# Patient Record
Sex: Male | Born: 1980
Health system: Southern US, Community
[De-identification: ages and names within clinical notes are randomized; demographics above are authoritative.]

## PROBLEM LIST (undated history)

## (undated) DIAGNOSIS — K219 Gastro-esophageal reflux disease without esophagitis: Secondary | ICD-10-CM

## (undated) HISTORY — PX: WISDOM TOOTH EXTRACTION: SHX21

---

## 2005-10-17 ENCOUNTER — Emergency Department (HOSPITAL_COMMUNITY): Admission: EM | Admit: 2005-10-17 | Discharge: 2005-10-19 | Payer: Self-pay | Admitting: Emergency Medicine

## 2011-08-09 ENCOUNTER — Encounter: Payer: Self-pay | Admitting: Emergency Medicine

## 2011-08-09 ENCOUNTER — Emergency Department (HOSPITAL_COMMUNITY)
Admission: EM | Admit: 2011-08-09 | Discharge: 2011-08-09 | Disposition: A | Discharge status: Home / Self Care | Payer: 59 | Attending: Emergency Medicine | Admitting: Emergency Medicine

## 2011-08-09 DIAGNOSIS — L03317 Cellulitis of buttock: Secondary | ICD-10-CM | POA: Insufficient documentation

## 2011-08-09 DIAGNOSIS — L0231 Cutaneous abscess of buttock: Secondary | ICD-10-CM | POA: Insufficient documentation

## 2011-08-09 DIAGNOSIS — J45909 Unspecified asthma, uncomplicated: Secondary | ICD-10-CM | POA: Insufficient documentation

## 2011-08-09 DIAGNOSIS — L0291 Cutaneous abscess, unspecified: Secondary | ICD-10-CM

## 2011-08-09 MED ORDER — LIDOCAINE HCL (PF) 2 % IJ SOLN
10.0000 mL | Freq: Once | INTRAMUSCULAR | Status: AC
  Administered 2011-08-09: 10 mL
  Filled 2011-08-09: qty 10

## 2011-08-09 MED ORDER — DOXYCYCLINE HYCLATE 100 MG PO TABS
100.0000 mg | ORAL_TABLET | Freq: Once | ORAL | Status: AC
  Administered 2011-08-09: 100 mg via ORAL
  Filled 2011-08-09: qty 1

## 2011-08-09 MED ORDER — HYDROCODONE-ACETAMINOPHEN 5-325 MG PO TABS
1.0000 | ORAL_TABLET | Freq: Once | ORAL | Status: AC
  Administered 2011-08-09: 1 via ORAL
  Filled 2011-08-09: qty 1

## 2011-08-09 MED ORDER — DOXYCYCLINE HYCLATE 100 MG PO CAPS: 100.0000 mg | ORAL_CAPSULE | Freq: Two times a day (BID) | ORAL | Status: DC

## 2011-08-09 MED ORDER — OXYCODONE-ACETAMINOPHEN 5-325 MG PO TABS: 1.0000 | ORAL_TABLET | ORAL | Status: DC | PRN

## 2011-08-09 MED ORDER — LIDOCAINE HCL (PF) 1 % IJ SOLN
5.0000 mL | Freq: Once | INTRAMUSCULAR | Status: DC
  Filled 2011-08-09: qty 5

## 2011-08-09 NOTE — ED Notes (Signed)
Patient reports abscess to right buttocks that appeared Monday. Gradually worsening pain and symptoms. Denies fever.

## 2011-08-09 NOTE — ED Notes (Signed)
Pt a/ox4. Resp even and unlabored. NAD at this time. D/C instructions reviewed with pt. Pt verbalized understanding. Pt ambulated to lobby with steady gate.  

## 2011-08-09 NOTE — ED Provider Notes (Signed)
History     CSN: [id number] Arrival date & time: 08/09/2011 11:10 AM   First MD Initiated Contact with Patient 08/09/11 1127      Chief Complaint  Patient presents with  . Abscess    (Consider location/radiation/quality/duration/timing/severity/associated sxs/prior treatment) HPI Comments: patiet c/o abscess to his right buttock for several days.  Began small but increased in size and redness.  States he tried to drain it by sticking it with a pin.  He denies drainage, fever, or vomiting   Patient is a 30 y.o. male presenting with abscess. The history is provided by the patient.  Abscess  This is a new problem. The current episode started less than one week ago. The onset was gradual. The problem occurs continuously. The problem has been unchanged. The abscess is present on the right buttock. The problem is moderate. The abscess is characterized by painfulness, redness and swelling. It is unknown what he was exposed to. The abscess first occurred at home. Pertinent negatives include no decrease in physical activity, no fever, no vomiting and no decreased responsiveness. His past medical history does not include skin abscesses in family. There were no sick contacts. He has received no recent medical care.    Past Medical History  Diagnosis Date  . Asthma     History reviewed. No pertinent past surgical history.  History reviewed. No pertinent family history.  History  Substance Use Topics  . Smoking status: Never Smoker   . Smokeless tobacco: Not on file  . Alcohol Use: No      Review of Systems  Constitutional: Negative for fever, chills and decreased responsiveness.  Respiratory: Negative for shortness of breath.   Cardiovascular: Negative for chest pain.  Gastrointestinal: Negative for nausea, vomiting and abdominal pain.  Genitourinary: Negative for dysuria.  Musculoskeletal: Negative for myalgias, back pain and arthralgias.  Skin: Positive for color change and  wound. Negative for rash.  Neurological: Negative for dizziness, weakness and numbness.  Hematological: Negative for adenopathy. Does not bruise/bleed easily.  All other systems reviewed and are negative.    Allergies  Review of patient's allergies indicates no known allergies.  Home Medications  No current outpatient prescriptions on file.  BP 110/69  Pulse 94  Temp(Src) 98.9 F (37.2 C) (Oral)  Resp 16  Ht 6\' 1"  (1.854 m)  Wt 170 lb (77.111 kg)  BMI 22.43 kg/m2  SpO2 100%  Physical Exam  Nursing note and vitals reviewed. Constitutional: He is oriented to person, place, and time. He appears well-developed and well-nourished. No distress.  HENT:  Head: Normocephalic and atraumatic.  Mouth/Throat: Oropharynx is clear and moist.  Cardiovascular: Normal rate, regular rhythm and normal heart sounds.   Pulmonary/Chest: Effort normal and breath sounds normal. No respiratory distress.  Musculoskeletal: Normal range of motion. He exhibits tenderness.       Back:  Neurological: He is alert and oriented to person, place, and time. No cranial nerve deficit. He exhibits normal muscle tone. Coordination normal.  Skin: Skin is warm and dry.       See MS exam    ED Course  Procedures (including critical care time)    INCISION AND DRAINAGE Performed by: Maxwell Caul. Consent: Verbal consent obtained. Risks and benefits: risks, benefits and alternatives were discussed Type: abscess  Body area: right buttock Anesthesia: local infiltration  Local anesthetic: lidocaine2% w/o epinephrine  Anesthetic total: 3 ml  Complexity: complex Blunt dissection to break up loculations  Drainage: purulent  Drainage amount: moderate Packing  material: 1/4 in iodoform gauze  Patient tolerance: Patient tolerated the procedure well with no immediate complications.      MDM    12:42 PM Moderate surrounding erythema and induration. Non-toxic appearing.  Patient agrees to return  here in 1-2 days for recheck. Advised abscess may need re-packing.  Will prescribe doxy and pain medication        Nemiah Kissner L. Trisha Mangle, [place] 08/09/11 2013

## 2011-08-09 NOTE — ED Notes (Signed)
Pt presents with large abscess to right buttock. Site is red, swollen, and painful. Abscess appears to have already popped and partially drained. Pt denies fever. NAD at this time.

## 2011-08-09 NOTE — ED Notes (Signed)
Dressing applied and ABX given per order.

## 2011-08-09 NOTE — ED Provider Notes (Signed)
Medical screening examination/treatment/procedure(s) were performed by non-physician practitioner and as supervising physician I was immediately available for consultation/collaboration.  Hurman Horn, MD 08/09/11 2045

## 2011-08-11 ENCOUNTER — Emergency Department (HOSPITAL_COMMUNITY)
Admission: EM | Admit: 2011-08-11 | Discharge: 2011-08-11 | Disposition: A | Discharge status: Home / Self Care | Payer: 59 | Attending: Emergency Medicine | Admitting: Emergency Medicine

## 2011-08-11 ENCOUNTER — Encounter (HOSPITAL_COMMUNITY): Payer: Self-pay | Admitting: *Deleted

## 2011-08-11 DIAGNOSIS — Z5189 Encounter for other specified aftercare: Secondary | ICD-10-CM | POA: Insufficient documentation

## 2011-08-11 MED ORDER — CEFTRIAXONE SODIUM 1 G IJ SOLR
1.0000 g | Freq: Once | INTRAMUSCULAR | Status: AC
  Administered 2011-08-11: 1 g via INTRAMUSCULAR
  Filled 2011-08-11: qty 10

## 2011-08-11 MED ORDER — OXYCODONE-ACETAMINOPHEN 5-325 MG PO TABS
1.0000 | ORAL_TABLET | Freq: Once | ORAL | Status: AC
  Administered 2011-08-11: 1 via ORAL
  Filled 2011-08-11: qty 1

## 2011-08-11 MED ORDER — IBUPROFEN 800 MG PO TABS
800.0000 mg | ORAL_TABLET | Freq: Once | ORAL | Status: AC
  Administered 2011-08-11: 800 mg via ORAL
  Filled 2011-08-11: qty 1

## 2011-08-11 NOTE — ED Provider Notes (Signed)
Medical screening examination/treatment/procedure(s) were performed by non-physician practitioner and as supervising physician I was immediately available for consultation/collaboration.   Cormac Wint, MD 08/11/11 1755 

## 2011-08-11 NOTE — ED Notes (Signed)
Pt here to get wound check. Pt has I&D right buttock on Sunday with packing placed. Pt also c/o vomiting x 1 this am and fever of 99.9.

## 2011-08-11 NOTE — ED Provider Notes (Signed)
History     CSN: [id number] Arrival date & time: 08/11/2011 11:05 AM   First MD Initiated Contact with Patient 08/11/11 1223      Chief Complaint  Patient presents with  . Wound Check    (Consider location/radiation/quality/duration/timing/severity/associated sxs/prior treatment) Patient is a 30 y.o. male presenting with wound check. The history is provided by the patient.  Wound Check  He was treated in the ED 2 to 3 days ago. Previous treatment in the ED includes oral antibiotics and I&D of abscess. Treatments since wound repair include oral antibiotics. Fever duration: None. The maximum temperature noted was less than 100.4 F. There has been colored discharge from the wound. The redness has improved. The swelling has improved. The pain has not changed.    Past Medical History  Diagnosis Date  . Asthma     History reviewed. No pertinent past surgical history.  History reviewed. No pertinent family history.  History  Substance Use Topics  . Smoking status: Never Smoker   . Smokeless tobacco: Not on file  . Alcohol Use: No      Review of Systems  Constitutional: Negative for activity change.       All ROS Neg except as noted in HPI  HENT: Negative for nosebleeds and neck pain.   Eyes: Negative for photophobia and discharge.  Respiratory: Negative for cough, shortness of breath and wheezing.   Cardiovascular: Negative for chest pain and palpitations.  Gastrointestinal: Negative for abdominal pain and blood in stool.  Genitourinary: Negative for dysuria, frequency and hematuria.  Musculoskeletal: Negative for back pain and arthralgias.  Skin: Positive for wound.  Neurological: Negative for dizziness, seizures and speech difficulty.  Psychiatric/Behavioral: Negative for hallucinations and confusion.    Allergies  Review of patient's allergies indicates no known allergies.  Home Medications   Current Outpatient Rx  Name Route Sig Dispense Refill  . DOXYCYCLINE  HYCLATE 100 MG PO CAPS Oral Take 1 capsule (100 mg total) by mouth 2 (two) times daily. 20 capsule 0  . OXYCODONE-ACETAMINOPHEN 5-325 MG PO TABS Oral Take 1 tablet by mouth every 4 (four) hours as needed for pain. 20 tablet 0    BP 124/64  Pulse 86  Temp(Src) 98.3 F (36.8 C) (Oral)  Resp 18  Ht 6\' 1"  (1.854 m)  Wt 170 lb (77.111 kg)  BMI 22.43 kg/m2  SpO2 100%  Physical Exam  Nursing note and vitals reviewed. Constitutional: He is oriented to person, place, and time. He appears well-developed and well-nourished.  Non-toxic appearance.  HENT:  Head: Normocephalic.  Right Ear: Tympanic membrane and external ear normal.  Left Ear: Tympanic membrane and external ear normal.  Eyes: EOM and lids are normal. Pupils are equal, round, and reactive to light.  Neck: Normal range of motion. Neck supple. Carotid bruit is not present.  Cardiovascular: Normal rate, regular rhythm, normal heart sounds, intact distal pulses and normal pulses.   Pulmonary/Chest: Breath sounds normal. No respiratory distress.  Abdominal: Soft. Bowel sounds are normal. There is no tenderness. There is no guarding.  Musculoskeletal: Normal range of motion.        The I and D abscess area of the right buttocks continues to have mild to moderate drainage present. The packing is in place. There is no red streaking noted present. The area remains firm, but not hot. The area remains painful to manipulation. There is full range of motion of the right lower extremity.   Lymphadenopathy:  Head (right side): No submandibular adenopathy present.       Head (left side): No submandibular adenopathy present.    He has no cervical adenopathy.  Neurological: He is alert and oriented to person, place, and time. He has normal strength. No cranial nerve deficit or sensory deficit.  Skin: Skin is warm and dry.  Psychiatric: He has a normal mood and affect. His speech is normal.    ED Course  Procedures (including critical care  time)  Labs Reviewed - No data to display No results found. Pulse oximetry 100% on room air. Within normal limits by my interpretation.  1. Wound check, abscess       MDM  I have reviewed nursing notes, vital signs, and all appropriate lab and imaging results for this patient. The patient had an incision and drainage of an abscess of the right buttocks 2-3 days ago. He was placed on antibiotic coverage. The site has improved. But remains somewhat firm and tender. There continues to be mild to moderate drainage. The plan at this time is for the patient to receive Rocephin 1 g IM. 2 continue the doxycycline and the Percocet. 2 initiate salt water tub soaks daily. The patient is to return to the emergency department or see his primary care physician if any changes, problems, or concerns. He       Kathie Dike, [place] 08/11/11 [phone]

## 2011-08-11 NOTE — ED Notes (Signed)
Pt had I and D

## 2014-10-12 ENCOUNTER — Telehealth: Payer: Self-pay | Admitting: Family Medicine

## 2014-10-12 MED ORDER — OSELTAMIVIR PHOSPHATE 75 MG PO CAPS: 75.0000 mg | ORAL_CAPSULE | Freq: Every day | ORAL | Status: DC

## 2014-10-12 NOTE — Telephone Encounter (Signed)
Wife aware that rx sent to pharmacy

## 2014-10-12 NOTE — Telephone Encounter (Signed)
tamiflu sent to pharmacy 

## 2014-11-27 ENCOUNTER — Ambulatory Visit (INDEPENDENT_AMBULATORY_CARE_PROVIDER_SITE_OTHER): Payer: 59 | Admitting: Family Medicine

## 2014-11-27 ENCOUNTER — Encounter: Payer: Self-pay | Admitting: Family Medicine

## 2014-11-27 VITALS — BP 107/70 | HR 90 | Temp 97.6°F | Ht 71.0 in | Wt 163.2 lb

## 2014-11-27 DIAGNOSIS — R05 Cough: Secondary | ICD-10-CM

## 2014-11-27 DIAGNOSIS — R509 Fever, unspecified: Secondary | ICD-10-CM

## 2014-11-27 DIAGNOSIS — J029 Acute pharyngitis, unspecified: Secondary | ICD-10-CM | POA: Diagnosis not present

## 2014-11-27 DIAGNOSIS — R059 Cough, unspecified: Secondary | ICD-10-CM

## 2014-11-27 LAB — POCT RAPID STREP A (OFFICE): RAPID STREP A SCREEN: NEGATIVE

## 2014-11-27 LAB — POCT INFLUENZA A/B
INFLUENZA A, POC: NEGATIVE
INFLUENZA B, POC: NEGATIVE

## 2014-11-27 MED ORDER — OSELTAMIVIR PHOSPHATE 75 MG PO CAPS: 75.0000 mg | ORAL_CAPSULE | Freq: Two times a day (BID) | ORAL | Status: DC

## 2014-11-27 NOTE — Progress Notes (Signed)
Subjective:  Patient ID: Hunter Foster, male    DOB: 05/03/1981  Age: 34 y.o. MRN: [id number]  CC: URI   HPI QUINTERIUS GAIDA presents for Symptoms include congestion, facial pain, nasal congestion, 102 fever, non productive cough all night, post nasal drip with chills, night sweats. Onset of symptoms was a 2 days ago, gradually worsening since that time. Pt.is drinking moderate amounts of fluids.  TAking Mucinex D , nyquil , then had diarrhea X 3 today. Body aches for 2-3 days now primarily in his back and his legs. Fever went to 102 yesterday. Then last night he coughed all night and woke up this morning with diarrhea  History Obie has a past medical history of Asthma.   He has no past surgical history on file.   His family history is not on file.He reports that he has never smoked. He does not have any smokeless tobacco history on file. He reports that he does not drink alcohol or use illicit drugs.  No current outpatient prescriptions on file prior to visit.   No current facility-administered medications on file prior to visit.    ROS Review of Systems  Constitutional: Negative for fever, chills, activity change and appetite change.  HENT: Positive for congestion, postnasal drip and rhinorrhea. Negative for ear discharge, ear pain, hearing loss, nosebleeds, sneezing and trouble swallowing.   Respiratory: Negative for chest tightness and shortness of breath.   Cardiovascular: Negative for chest pain and palpitations.  Skin: Negative for rash.    Objective:  BP 107/70 mmHg  Pulse 90  Temp(Src) 97.6 F (36.4 C) (Oral)  Ht _0  (1.803 m)  Wt 163 lb 3.2 oz (74.027 kg)  BMI 22.77 kg/m2  BP Readings from Last 3 Encounters:  11/27/14 107/70  08/11/11 113/75  08/09/11 110/69    Wt Readings from Last 3 Encounters:  11/27/14 163 lb 3.2 oz (74.027 kg)  08/11/11 170 lb (77.111 kg)  08/09/11 170 lb (77.111 kg)     Physical Exam  Constitutional: He appears  well-developed and well-nourished.  HENT:  Head: Normocephalic and atraumatic.  Right Ear: Tympanic membrane and external ear normal. No decreased hearing is noted.  Left Ear: Tympanic membrane and external ear normal. No decreased hearing is noted.  Nose: Mucosal edema present. Right sinus exhibits no frontal sinus tenderness. Left sinus exhibits no frontal sinus tenderness.  Mouth/Throat: No oropharyngeal exudate or posterior oropharyngeal erythema.  Neck: No Brudzinski's sign noted.  Pulmonary/Chest: Breath sounds normal. No respiratory distress.  Lymphadenopathy:       Head (right side): No preauricular adenopathy present.       Head (left side): No preauricular adenopathy present.       Right cervical: No superficial cervical adenopathy present.      Left cervical: No superficial cervical adenopathy present.    No results found for: HGBA1C  No results found for: WBC, HGB, HCT, PLT, GLUCOSE, CHOL, TRIG, HDL, LDLDIRECT, LDLCALC, ALT, AST, NA, K, CL, CREATININE, BUN, CO2, TSH, PSA, INR, GLUF, HGBA1C, MICROALBUR  No results found.  Assessment & Plan:   Ottie was seen today for uri.  Diagnoses and all orders for this visit:  Sore throat Orders: -     POCT Influenza A/B -     POCT rapid strep A -     CBC with Differential/Platelet -     CMP14+EGFR  Cough Orders: -     POCT Influenza A/B -     POCT rapid strep A -  CBC with Differential/Platelet -     CMP14+EGFR  Other specified fever Orders: -     POCT Influenza A/B -     POCT rapid strep A -     CBC with Differential/Platelet -     CMP14+EGFR  Other orders -     oseltamivir (TAMIFLU) 75 MG capsule; Take 1 capsule (75 mg total) by mouth 2 (two) times daily.   I have discontinued Mr. Weightman's oseltamivir. I am also having him start on oseltamivir.  Meds ordered this encounter  Medications  . oseltamivir (TAMIFLU) 75 MG capsule    Sig: Take 1 capsule (75 mg total) by mouth 2 (two) times daily.    Dispense:   10 capsule    Refill:  0     Follow-up: Return if symptoms worsen or fail to improve.  Claretta Fraise, M.D.

## 2014-11-27 NOTE — Patient Instructions (Signed)
Get Immodium AD as needed for diarrhea

## 2014-11-28 LAB — CMP14+EGFR
ALT: 18 IU/L (ref 0–44)
AST: 17 IU/L (ref 0–40)
Albumin/Globulin Ratio: 1.8 (ref 1.1–2.5)
Albumin: 4.7 g/dL (ref 3.5–5.5)
Alkaline Phosphatase: 67 IU/L (ref 39–117)
BUN/Creatinine Ratio: 7 — ABNORMAL LOW (ref 8–19)
BUN: 8 mg/dL (ref 6–20)
Bilirubin Total: 0.6 mg/dL (ref 0.0–1.2)
CO2: 25 mmol/L (ref 18–29)
Calcium: 9.2 mg/dL (ref 8.7–10.2)
Chloride: 98 mmol/L (ref 97–108)
Creatinine, Ser: 1.08 mg/dL (ref 0.76–1.27)
GFR calc Af Amer: 105 mL/min/1.73
GFR calc non Af Amer: 91 mL/min/1.73
Globulin, Total: 2.6 g/dL (ref 1.5–4.5)
Glucose: 90 mg/dL (ref 65–99)
Potassium: 4.4 mmol/L (ref 3.5–5.2)
Sodium: 138 mmol/L (ref 134–144)
Total Protein: 7.3 g/dL (ref 6.0–8.5)

## 2014-11-28 LAB — CBC WITH DIFFERENTIAL/PLATELET
Basophils Absolute: 0 x10E3/uL (ref 0.0–0.2)
Basos: 0 %
Eos: 1 %
Eosinophils Absolute: 0.1 x10E3/uL (ref 0.0–0.4)
HCT: 46.5 % (ref 37.5–51.0)
Hemoglobin: 16 g/dL (ref 12.6–17.7)
Immature Grans (Abs): 0 x10E3/uL (ref 0.0–0.1)
Immature Granulocytes: 0 %
Lymphocytes Absolute: 1.3 x10E3/uL (ref 0.7–3.1)
Lymphs: 19 %
MCH: 32.5 pg (ref 26.6–33.0)
MCHC: 34.4 g/dL (ref 31.5–35.7)
MCV: 95 fL (ref 79–97)
Monocytes Absolute: 0.8 x10E3/uL (ref 0.1–0.9)
Monocytes: 13 %
Neutrophils Absolute: 4.4 x10E3/uL (ref 1.4–7.0)
Neutrophils Relative %: 67 %
Platelets: 205 x10E3/uL (ref 150–379)
RBC: 4.92 x10E6/uL (ref 4.14–5.80)
RDW: 12.8 % (ref 12.3–15.4)
WBC: 6.5 x10E3/uL (ref 3.4–10.8)

## 2014-11-29 ENCOUNTER — Telehealth: Payer: Self-pay | Admitting: Family Medicine

## 2014-11-29 MED ORDER — GUAIFENESIN-CODEINE 100-10 MG/5ML PO SYRP: 5.0000 mL | ORAL_SOLUTION | ORAL | Status: DC | PRN

## 2014-11-29 NOTE — Telephone Encounter (Signed)
Stp he is aware rx called into pharmacy.

## 2014-11-29 NOTE — Telephone Encounter (Signed)
Cheratussin prescriptions printed please call it in for him and let him know that it will be at the pharmacy. Thanks, WS.

## 2014-11-29 NOTE — Telephone Encounter (Signed)
Patient is still complaining with a fever, and cough. The cough he states is horrible and is unable to sleep with the cough because it is so bad.

## 2014-11-29 NOTE — Telephone Encounter (Signed)
Patient wants to know if you can recommend anything to help with the cough.

## 2014-11-29 NOTE — Telephone Encounter (Signed)
His tests and his exam showed pretty conclusively that this was viral. However, since he feels so bad I would be happy to reevaluate him. See if we can work him in this afternoon. Thanks, WS.

## 2015-08-08 ENCOUNTER — Encounter: Payer: Self-pay | Admitting: Pediatrics

## 2015-08-08 ENCOUNTER — Ambulatory Visit (INDEPENDENT_AMBULATORY_CARE_PROVIDER_SITE_OTHER): Payer: 59 | Admitting: Pediatrics

## 2015-08-08 VITALS — BP 123/70 | HR 85 | Temp 97.5°F | Ht 71.0 in | Wt 174.8 lb

## 2015-08-08 DIAGNOSIS — J309 Allergic rhinitis, unspecified: Secondary | ICD-10-CM

## 2015-08-08 DIAGNOSIS — J019 Acute sinusitis, unspecified: Secondary | ICD-10-CM | POA: Diagnosis not present

## 2015-08-08 DIAGNOSIS — J069 Acute upper respiratory infection, unspecified: Secondary | ICD-10-CM | POA: Diagnosis not present

## 2015-08-08 MED ORDER — AMOXICILLIN-POT CLAVULANATE 875-125 MG PO TABS: 1.0000 | ORAL_TABLET | Freq: Two times a day (BID) | ORAL | Status: DC

## 2015-08-08 MED ORDER — FLUTICASONE PROPIONATE 50 MCG/ACT NA SUSP: 2.0000 | Freq: Every day | NASAL | Status: DC

## 2015-08-08 NOTE — Patient Instructions (Signed)
Neti pot sinus rinses three times a day with distilled water  Flonase two sprays each nostril

## 2015-08-08 NOTE — Progress Notes (Signed)
    Subjective:    Patient ID: Hunter Foster, male    DOB: 08/30/1980, 34 y.o.   MRN: [id number]  CC: Sinusitis   HPI: Hunter Foster is a 34 y.o. male presenting for Sinusitis  Ongoing for two months, voice comes and goes. Does ok during the day but then at night gets headaches and nasal congestion Using OTC mucinex Works outside in the dust a lot Lots of coughing 5 days ago   Depression screen PHQ 2/9 11/27/2014  Decreased Interest 0  Down, Depressed, Hopeless 0  PHQ - 2 Score 0     Relevant past medical, surgical, family and social history reviewed and updated as indicated. Interim medical history since our last visit reviewed. Allergies and medications reviewed and updated.    ROS: Per HPI unless specifically indicated above  History  Smoking status  . Never Smoker   Smokeless tobacco  . Not on file    Past Medical History none    Objective:    BP 123/70 mmHg  Pulse 85  Temp(Src) 97.5 F (36.4 C) (Oral)  Ht 5\' 11"  (1.803 m)  Wt 174 lb 12.8 oz (79.289 kg)  BMI 24.39 kg/m2  Wt Readings from Last 3 Encounters:  08/08/15 174 lb 12.8 oz (79.289 kg)  11/27/14 163 lb 3.2 oz (74.027 kg)  08/11/11 170 lb (77.111 kg)    Gen: NAD, alert, cooperative with exam, NCAT, congested EYES: EOMI, no scleral injection or icterus ENT:  TMs dull gray b/l, OP with mild erythema, TTP over maxillary sinuses b/l LYMPH: no cervical LAD CV: NRRR, normal S1/S2, no murmur, distal pulses 2+ b/l Resp: CTABL, no wheezes, normal WOB Abd: +BS, soft, NTND. no guarding or organomegaly Ext: No edema, warm Neuro: Alert and oriented     Assessment & Plan:    Hunter Foster was seen today for sinusitis, will treat with abx as below. RTC if not improving.  Diagnoses and all orders for this visit:  Acute URI  Acute sinusitis, recurrence not specified, unspecified location -     amoxicillin-clavulanate (AUGMENTIN) 875-125 MG tablet; Take 1 tablet by mouth 2 (two) times daily.  Allergic  rhinitis, unspecified allergic rhinitis type -     fluticasone (FLONASE) 50 MCG/ACT nasal spray; Place 2 sprays into both nostrils daily.    Follow up plan: Return in about 3 months (around 11/06/2015).  Rex Krasarol Sarinity Dicicco, MD [facility] 08/08/2015, 5:38 PM

## 2015-08-27 ENCOUNTER — Telehealth: Payer: Self-pay | Admitting: Family Medicine

## 2016-03-25 ENCOUNTER — Ambulatory Visit (INDEPENDENT_AMBULATORY_CARE_PROVIDER_SITE_OTHER): Payer: 59 | Admitting: Pediatrics

## 2016-03-25 ENCOUNTER — Encounter: Payer: Self-pay | Admitting: Pediatrics

## 2016-03-25 VITALS — BP 115/72 | HR 85 | Temp 97.4°F | Ht 71.0 in | Wt 169.0 lb

## 2016-03-25 DIAGNOSIS — J069 Acute upper respiratory infection, unspecified: Secondary | ICD-10-CM | POA: Diagnosis not present

## 2016-03-25 DIAGNOSIS — J309 Allergic rhinitis, unspecified: Secondary | ICD-10-CM

## 2016-03-25 MED ORDER — CETIRIZINE HCL 10 MG PO TABS: 10.0000 mg | ORAL_TABLET | Freq: Every day | ORAL | 11 refills | Status: DC

## 2016-03-25 MED ORDER — FLUTICASONE PROPIONATE 50 MCG/ACT NA SUSP: 2.0000 | Freq: Every day | NASAL | 6 refills | Status: DC

## 2016-03-25 NOTE — Patient Instructions (Signed)
Netipot with distilled water 2-3 times a day to clear out sinuses Or Normal saline nasal spray Flonase steroid nasal spray Ibuprofen 600mg -800mg  three times a day Lots of fluids

## 2016-03-25 NOTE — Progress Notes (Signed)
    Subjective:    Patient ID: Hunter Foster, male    DOB: 1981-04-10, 35 y.o.   MRN: [id number]  CC: Sinus pressure; Headache; and Nasal Congestion   HPI: Hunter Foster is a 35 y.o. male presenting for Sinus pressure; Headache; and Nasal Congestion  Started 4 days ago with congestion Taking mucinex Cant breathe through nose at all No fevers Appetite is fine Feeling well otherwise Tried flonase a few times   Depression screen [facility] 2/9 03/25/2016 11/27/2014  Decreased Interest 0 0  Down, Depressed, Hopeless 0 0  PHQ - 2 Score 0 0     Relevant past medical, surgical, family and social history reviewed and updated. Interim medical history since our last visit reviewed. Allergies and medications reviewed and updated.  History  Smoking Status  . Never Smoker  Smokeless Tobacco  . Never Used    ROS: Per HPI      Objective:    BP 115/72 (BP Location: Right Arm, Patient Position: Sitting, Cuff Size: Normal)   Pulse 85   Temp 97.4 F (36.3 C) (Oral)   Ht 5\' 11"  (1.803 m)   Wt 169 lb (76.7 kg)   BMI 23.57 kg/m   Wt Readings from Last 3 Encounters:  03/25/16 169 lb (76.7 kg)  08/08/15 174 lb 12.8 oz (79.3 kg)  11/27/14 163 lb 3.2 oz (74 kg)     Gen: NAD, alert, cooperative with exam, NCAT EYES: EOMI, no scleral injection or icterus ENT:  TMs pearly gray b/l, clear effusion R TM, OP with mild erythema LYMPH: no cervical LAD CV: NRRR, normal S1/S2, no murmur, distal pulses 2+ b/l Resp: CTABL, no wheezes, normal WOB Ext: No edema, warm Neuro: Alert and oriented, strength equal b/l UE and LE, coordination grossly normal MSK: normal muscle bulk     Assessment & Plan:    Oconnor was seen today for sinus pressure, headache and nasal congestion.  Diagnoses and all orders for this visit:  Acute URI   Discussed symptomatic care. If getting worse let me know.  Follow up plan: As needed  Rex Kras, MD [facility] 03/25/2016, 8:45  AM

## 2018-01-18 ENCOUNTER — Encounter: Payer: 59 | Admitting: Family Medicine

## 2018-01-20 ENCOUNTER — Encounter: Payer: Self-pay | Admitting: Family Medicine

## 2018-01-20 ENCOUNTER — Ambulatory Visit (INDEPENDENT_AMBULATORY_CARE_PROVIDER_SITE_OTHER): Payer: 59 | Admitting: Family Medicine

## 2018-01-20 VITALS — BP 120/77 | HR 82 | Temp 97.6°F | Ht 71.0 in | Wt 175.0 lb

## 2018-01-20 DIAGNOSIS — Z23 Encounter for immunization: Secondary | ICD-10-CM | POA: Diagnosis not present

## 2018-01-20 DIAGNOSIS — Z0000 Encounter for general adult medical examination without abnormal findings: Secondary | ICD-10-CM | POA: Diagnosis not present

## 2018-01-20 NOTE — Progress Notes (Signed)
BP 120/77   Pulse 82   Temp 97.6 F (36.4 C) (Oral)   Ht 5' 11"  (1.803 m)   Wt 175 lb (79.4 kg)   BMI 24.41 kg/m    Subjective:    Patient ID: Hunter Foster, male    DOB: [birth year], 37 y.o.   MRN: [id number]  HPI: Hunter Foster is a 37 y.o. male presenting on 01/20/2018 for Annual Exam   HPI Adult well exam She is coming in today for adult well exam and physical.  He denies any major health issues. Patient denies any chest pain, shortness of breath, headaches or vision issues, abdominal complaints, diarrhea, nausea, vomiting, or joint issues.   Relevant past medical, surgical, family and social history reviewed and updated as indicated. Interim medical history since our last visit reviewed. Allergies and medications reviewed and updated.  Review of Systems  Constitutional: Negative for chills and fever.  HENT: Negative for ear pain and tinnitus.   Eyes: Negative for pain.  Respiratory: Negative for cough, shortness of breath and wheezing.   Cardiovascular: Negative for chest pain, palpitations and leg swelling.  Gastrointestinal: Negative for abdominal pain, blood in stool, constipation and diarrhea.  Genitourinary: Negative for dysuria and hematuria.  Musculoskeletal: Negative for back pain and myalgias.  Skin: Negative for rash.  Neurological: Negative for dizziness, weakness and headaches.  Psychiatric/Behavioral: Negative for suicidal ideas.    Per HPI unless specifically indicated above   Allergies as of 01/20/2018   No Known Allergies     Medication List    as of 01/20/2018  3:22 PM   You have not been prescribed any medications.        Objective:    BP 120/77   Pulse 82   Temp 97.6 F (36.4 C) (Oral)   Ht 5' 11"  (1.803 m)   Wt 175 lb (79.4 kg)   BMI 24.41 kg/m   Wt Readings from Last 3 Encounters:  01/20/18 175 lb (79.4 kg)  03/25/16 169 lb (76.7 kg)  08/08/15 174 lb 12.8 oz (79.3 kg)    Physical Exam  Constitutional: He is oriented  to person, place, and time. He appears well-developed and well-nourished. No distress.  HENT:  Right Ear: External ear normal.  Left Ear: External ear normal.  Nose: Nose normal.  Mouth/Throat: Oropharynx is clear and moist. No oropharyngeal exudate.  Eyes: Conjunctivae are normal. No scleral icterus.  Neck: Neck supple. No thyromegaly present.  Cardiovascular: Normal rate, regular rhythm, normal heart sounds and intact distal pulses.  No murmur heard. Pulmonary/Chest: Effort normal and breath sounds normal. No respiratory distress. He has no wheezes.  Abdominal: Soft. Bowel sounds are normal. He exhibits no distension. There is no tenderness. There is no rebound and no guarding.  Musculoskeletal: Normal range of motion. He exhibits no edema.  Lymphadenopathy:    He has no cervical adenopathy.  Neurological: He is alert and oriented to person, place, and time. Coordination normal.  Skin: Skin is warm and dry. No rash noted. He is not diaphoretic.  Psychiatric: He has a normal mood and affect. His behavior is normal.  Vitals reviewed.       Assessment & Plan:   Problem List Items Addressed This Visit    None    Visit Diagnoses    Well adult exam    -  Primary   Relevant Orders   CBC with Differential/Platelet   Lipid panel   CMP14+EGFR       Follow up  plan: Return in about 1 year (around 01/21/2019), or if symptoms worsen or fail to improve.  Counseling provided for all of the vaccine components Orders Placed This Encounter  Procedures  . Tdap vaccine greater than or equal to 7yo IM  . CBC with Differential/Platelet  . Lipid panel  . Craven Dettinger, MD [facility] 01/20/2018, 3:22 PM

## 2018-01-21 LAB — CMP14+EGFR
A/G RATIO: 1.8 (ref 1.2–2.2)
ALK PHOS: 53 IU/L (ref 39–117)
ALT: 22 IU/L (ref 0–44)
AST: 20 IU/L (ref 0–40)
Albumin: 4.6 g/dL (ref 3.5–5.5)
BILIRUBIN TOTAL: 0.9 mg/dL (ref 0.0–1.2)
BUN/Creatinine Ratio: 11 (ref 9–20)
BUN: 13 mg/dL (ref 6–20)
CHLORIDE: 100 mmol/L (ref 96–106)
CO2: 24 mmol/L (ref 20–29)
Calcium: 9.3 mg/dL (ref 8.7–10.2)
Creatinine, Ser: 1.16 mg/dL (ref 0.76–1.27)
GFR calc Af Amer: 94 mL/min/{1.73_m2} (ref >59)
GFR calc non Af Amer: 82 mL/min/{1.73_m2} (ref >59)
GLUCOSE: 83 mg/dL (ref 65–99)
Globulin, Total: 2.5 g/dL (ref 1.5–4.5)
POTASSIUM: 4 mmol/L (ref 3.5–5.2)
Sodium: 142 mmol/L (ref 134–144)
Total Protein: 7.1 g/dL (ref 6.0–8.5)

## 2018-01-21 LAB — CBC WITH DIFFERENTIAL/PLATELET
BASOS ABS: 0 10*3/uL (ref 0.0–0.2)
Basos: 0 %
EOS (ABSOLUTE): 0.1 10*3/uL (ref 0.0–0.4)
Eos: 1 %
Hematocrit: 42.1 % (ref 37.5–51.0)
Hemoglobin: 14.7 g/dL (ref 13.0–17.7)
IMMATURE GRANS (ABS): 0 10*3/uL (ref 0.0–0.1)
Immature Granulocytes: 0 %
LYMPHS ABS: 2.4 10*3/uL (ref 0.7–3.1)
LYMPHS: 38 %
MCH: 33.1 pg — ABNORMAL HIGH (ref 26.6–33.0)
MCHC: 34.9 g/dL (ref 31.5–35.7)
MCV: 95 fL (ref 79–97)
MONOS ABS: 0.5 10*3/uL (ref 0.1–0.9)
Monocytes: 9 %
NEUTROS ABS: 3.2 10*3/uL (ref 1.4–7.0)
Neutrophils: 52 %
Platelets: 226 10*3/uL (ref 150–450)
RBC: 4.44 x10E6/uL (ref 4.14–5.80)
RDW: 13.1 % (ref 12.3–15.4)
WBC: 6.2 10*3/uL (ref 3.4–10.8)

## 2018-01-21 LAB — LIPID PANEL
CHOL/HDL RATIO: 4.4 ratio (ref 0.0–5.0)
Cholesterol, Total: 211 mg/dL — ABNORMAL HIGH (ref 100–199)
HDL: 48 mg/dL (ref >39)
LDL CALC: 145 mg/dL — AB (ref 0–99)
TRIGLYCERIDES: 90 mg/dL (ref 0–149)
VLDL CHOLESTEROL CAL: 18 mg/dL (ref 5–40)

## 2018-02-03 ENCOUNTER — Encounter (INDEPENDENT_AMBULATORY_CARE_PROVIDER_SITE_OTHER): Payer: Self-pay

## 2018-02-10 ENCOUNTER — Encounter: Payer: Self-pay | Admitting: Family Medicine

## 2018-02-10 ENCOUNTER — Ambulatory Visit (INDEPENDENT_AMBULATORY_CARE_PROVIDER_SITE_OTHER): Payer: 59 | Admitting: Family Medicine

## 2018-02-10 VITALS — BP 108/77 | HR 78 | Temp 97.1°F | Ht 71.0 in | Wt 175.4 lb

## 2018-02-10 DIAGNOSIS — B029 Zoster without complications: Secondary | ICD-10-CM | POA: Diagnosis not present

## 2018-02-10 DIAGNOSIS — T23252A Burn of second degree of left palm, initial encounter: Secondary | ICD-10-CM | POA: Diagnosis not present

## 2018-02-10 MED ORDER — VALACYCLOVIR HCL 1 G PO TABS: 1000.0000 mg | ORAL_TABLET | Freq: Two times a day (BID) | ORAL | 0 refills | Status: DC
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
## 2018-06-29 ENCOUNTER — Telehealth: Payer: Self-pay | Admitting: Family Medicine

## 2018-06-29 ENCOUNTER — Encounter: Payer: Self-pay | Admitting: Family Medicine

## 2018-06-29 ENCOUNTER — Ambulatory Visit (INDEPENDENT_AMBULATORY_CARE_PROVIDER_SITE_OTHER): Payer: 59 | Admitting: Family Medicine

## 2018-06-29 VITALS — BP 120/76 | HR 84 | Temp 97.2°F | Ht 73.0 in | Wt 181.0 lb

## 2018-06-29 DIAGNOSIS — R0981 Nasal congestion: Secondary | ICD-10-CM

## 2018-06-29 DIAGNOSIS — R69 Illness, unspecified: Secondary | ICD-10-CM | POA: Diagnosis not present

## 2018-06-29 LAB — VERITOR FLU A/B WAIVED
Influenza A: NEGATIVE
Influenza B: NEGATIVE

## 2018-06-29 NOTE — Telephone Encounter (Signed)
Heather from the float pool had called patient and she has since talked to him so this phone encounter will be closed.

## 2018-07-03 ENCOUNTER — Encounter: Payer: Self-pay | Admitting: Family Medicine

## 2018-07-03 NOTE — Progress Notes (Signed)
Chief Complaint  Patient presents with  . Nasal Congestion    HPI  Patient presents today for Patient presents with upper respiratory congestion. Rhinorrhea that isclear There is moderate sore throat. Patient reports coughing frequently as well.  No sputum noted. There is no fever, chills or sweats. The patient denies being short of breath. Onset was 3-5 days ago. Gradually worsening. Tried OTCs without improvement.  PMH: Smoking status noted ROS: Per HPI  Objective: BP 120/76   Pulse 84   Temp (!) 97.2 F (36.2 C) (Oral)   Ht 6\' 1"  (1.854 m)   Wt 181 lb (82.1 kg)   BMI 23.88 kg/m  Gen: NAD, alert, cooperative with exam HEENT: NCAT, Nasal passages swollen CV: RRR, good S1/S2, no murmur Resp: Bronchitis changes with scattered wheezes, non-labored Ext: No edema, warm Neuro: Alert and oriented, No gross deficits Results for orders placed or performed in visit on 06/29/18  Veritor Flu A/B Waived  Result Value Ref Range   Influenza A Negative Negative   Influenza B Negative Negative    Assessment and plan:  1. Congestion of nasal sinus     Rest at home. OTC meds reviewed.  Orders Placed This Encounter  Procedures  . Veritor Flu A/B Waived    Order Specific Question:   Source    Answer:   nasel    Follow up as needed.  Mechele Claude, MD

## 2018-07-06 ENCOUNTER — Telehealth: Payer: Self-pay | Admitting: Family Medicine

## 2019-07-12 ENCOUNTER — Other Ambulatory Visit: Payer: Self-pay

## 2019-07-12 DIAGNOSIS — Z20822 Contact with and (suspected) exposure to covid-19: Secondary | ICD-10-CM

## 2019-07-14 LAB — NOVEL CORONAVIRUS, NAA: SARS-CoV-2, NAA: NOT DETECTED

## 2019-08-07 ENCOUNTER — Other Ambulatory Visit: Payer: Self-pay

## 2019-08-07 DIAGNOSIS — Z20822 Contact with and (suspected) exposure to covid-19: Secondary | ICD-10-CM

## 2019-08-09 LAB — NOVEL CORONAVIRUS, NAA: SARS-CoV-2, NAA: NOT DETECTED

## 2019-08-21 ENCOUNTER — Ambulatory Visit: Payer: 59 | Attending: Internal Medicine

## 2019-08-21 ENCOUNTER — Other Ambulatory Visit: Payer: Self-pay

## 2019-08-21 DIAGNOSIS — Z20828 Contact with and (suspected) exposure to other viral communicable diseases: Secondary | ICD-10-CM | POA: Diagnosis not present

## 2019-08-21 DIAGNOSIS — Z20822 Contact with and (suspected) exposure to covid-19: Secondary | ICD-10-CM

## 2019-08-23 LAB — NOVEL CORONAVIRUS, NAA: SARS-CoV-2, NAA: NOT DETECTED

## 2019-12-20 ENCOUNTER — Other Ambulatory Visit: Payer: 59

## 2020-01-17 ENCOUNTER — Other Ambulatory Visit: Payer: Self-pay

## 2020-01-17 ENCOUNTER — Encounter: Payer: Self-pay | Admitting: Family Medicine

## 2020-01-17 ENCOUNTER — Ambulatory Visit (INDEPENDENT_AMBULATORY_CARE_PROVIDER_SITE_OTHER): Payer: No Typology Code available for payment source | Admitting: Family Medicine

## 2020-01-17 VITALS — BP 105/62 | HR 83 | Temp 98.2°F | Ht 73.0 in | Wt 176.0 lb

## 2020-01-17 DIAGNOSIS — Z0000 Encounter for general adult medical examination without abnormal findings: Secondary | ICD-10-CM | POA: Diagnosis not present

## 2020-01-17 NOTE — Progress Notes (Signed)
BP 105/62   Pulse 83   Temp 98.2 F (36.8 C) (Temporal)   Ht 6' 1"  (1.854 m)   Wt 176 lb (79.8 kg)   BMI 23.22 kg/m    Subjective:   Patient ID: Hunter Foster, male    DOB: [birth year], 39 y.o.   MRN: [id number]  HPI: Hunter Foster is a 38 y.o. male presenting on 01/17/2020 for Annual Exam   HPI Adult well exam and physical Patient is coming in for adult well exam and physical.  Denies any major health issues and is doing well. Patient denies any chest pain, shortness of breath, headaches or vision issues, abdominal complaints, diarrhea, nausea, vomiting, or joint issues.   Relevant past medical, surgical, family and social history reviewed and updated as indicated. Interim medical history since our last visit reviewed. Allergies and medications reviewed and updated.  Review of Systems  Constitutional: Negative for chills and fever.  HENT: Negative for ear pain and tinnitus.   Eyes: Negative for pain.  Respiratory: Negative for cough, shortness of breath and wheezing.   Cardiovascular: Negative for chest pain, palpitations and leg swelling.  Gastrointestinal: Negative for abdominal pain, blood in stool, constipation and diarrhea.  Genitourinary: Negative for dysuria and hematuria.  Musculoskeletal: Negative for back pain and myalgias.  Skin: Negative for rash.  Neurological: Negative for dizziness, weakness and headaches.  Psychiatric/Behavioral: Negative for suicidal ideas.    Per HPI unless specifically indicated above   Allergies as of 01/17/2020   No Known Allergies     Medication List    as of Jan 17, 2020 10:14 AM   You have not been prescribed any medications.      Objective:   BP 105/62   Pulse 83   Temp 98.2 F (36.8 C) (Temporal)   Ht 6' 1"  (1.854 m)   Wt 176 lb (79.8 kg)   BMI 23.22 kg/m   Wt Readings from Last 3 Encounters:  01/17/20 176 lb (79.8 kg)  06/29/18 181 lb (82.1 kg)  02/10/18 175 lb 6.4 oz (79.6 kg)    Physical  Exam Vitals reviewed.  Constitutional:      General: He is not in acute distress.    Appearance: He is well-developed. He is not diaphoretic.  HENT:     Right Ear: External ear normal.     Left Ear: External ear normal.     Nose: Nose normal.     Mouth/Throat:     Pharynx: No oropharyngeal exudate.  Eyes:     General: No scleral icterus.       Right eye: No discharge.     Conjunctiva/sclera: Conjunctivae normal.     Pupils: Pupils are equal, round, and reactive to light.  Neck:     Thyroid: No thyromegaly.  Cardiovascular:     Rate and Rhythm: Normal rate and regular rhythm.     Heart sounds: Normal heart sounds. No murmur.  Pulmonary:     Effort: Pulmonary effort is normal. No respiratory distress.     Breath sounds: Normal breath sounds. No wheezing.  Abdominal:     General: Bowel sounds are normal. There is no distension.     Palpations: Abdomen is soft.     Tenderness: There is no abdominal tenderness. There is no guarding or rebound.  Musculoskeletal:        General: Normal range of motion.     Cervical back: Neck supple.  Lymphadenopathy:     Cervical: No cervical adenopathy.  Skin:    General: Skin is warm and dry.     Findings: No rash.  Neurological:     Mental Status: He is alert and oriented to person, place, and time.     Coordination: Coordination normal.  Psychiatric:        Behavior: Behavior normal.       Assessment & Plan:   Problem List Items Addressed This Visit    None    Visit Diagnoses    Well adult exam    -  Primary   Relevant Orders   CBC with Differential/Platelet   CMP14+EGFR   Lipid panel    Will do blood work, he is otherwise healthy, has a physically active job  Follow up plan: Return in about 1 year (around 01/16/2021), or if symptoms worsen or fail to improve.  Counseling provided for all of the vaccine components Orders Placed This Encounter  Procedures  . CBC with Differential/Platelet  . CMP14+EGFR  . Lipid panel     Caryl Pina, MD [facility] 01/17/2020, 10:14 AM

## 2020-01-18 LAB — CMP14+EGFR
ALT: 29 IU/L (ref 0–44)
AST: 24 IU/L (ref 0–40)
Albumin/Globulin Ratio: 2 (ref 1.2–2.2)
Albumin: 4.9 g/dL (ref 4.0–5.0)
Alkaline Phosphatase: 61 IU/L (ref 48–121)
BUN/Creatinine Ratio: 13 (ref 9–20)
BUN: 13 mg/dL (ref 6–20)
Bilirubin Total: 0.6 mg/dL (ref 0.0–1.2)
CO2: 25 mmol/L (ref 20–29)
Calcium: 9.7 mg/dL (ref 8.7–10.2)
Chloride: 100 mmol/L (ref 96–106)
Creatinine, Ser: 1.04 mg/dL (ref 0.76–1.27)
GFR calc Af Amer: 106 mL/min/{1.73_m2} (ref >59)
GFR calc non Af Amer: 92 mL/min/{1.73_m2} (ref >59)
Globulin, Total: 2.5 g/dL (ref 1.5–4.5)
Glucose: 89 mg/dL (ref 65–99)
Potassium: 4.8 mmol/L (ref 3.5–5.2)
Sodium: 141 mmol/L (ref 134–144)
Total Protein: 7.4 g/dL (ref 6.0–8.5)

## 2020-01-18 LAB — CBC WITH DIFFERENTIAL/PLATELET
Basophils Absolute: 0 10*3/uL (ref 0.0–0.2)
Basos: 0 %
EOS (ABSOLUTE): 0.1 10*3/uL (ref 0.0–0.4)
Eos: 1 %
Hematocrit: 46.7 % (ref 37.5–51.0)
Hemoglobin: 16.6 g/dL (ref 13.0–17.7)
Immature Grans (Abs): 0 10*3/uL (ref 0.0–0.1)
Immature Granulocytes: 0 %
Lymphocytes Absolute: 2.5 10*3/uL (ref 0.7–3.1)
Lymphs: 36 %
MCH: 34.1 pg — ABNORMAL HIGH (ref 26.6–33.0)
MCHC: 35.5 g/dL (ref 31.5–35.7)
MCV: 96 fL (ref 79–97)
Monocytes Absolute: 0.6 10*3/uL (ref 0.1–0.9)
Monocytes: 8 %
Neutrophils Absolute: 3.8 10*3/uL (ref 1.4–7.0)
Neutrophils: 55 %
Platelets: 273 10*3/uL (ref 150–450)
RBC: 4.87 x10E6/uL (ref 4.14–5.80)
RDW: 12.1 % (ref 11.6–15.4)
WBC: 6.9 10*3/uL (ref 3.4–10.8)

## 2020-01-18 LAB — LIPID PANEL
Chol/HDL Ratio: 4.3 ratio (ref 0.0–5.0)
Cholesterol, Total: 229 mg/dL — ABNORMAL HIGH (ref 100–199)
HDL: 53 mg/dL (ref >39)
LDL Chol Calc (NIH): 159 mg/dL — ABNORMAL HIGH (ref 0–99)
Triglycerides: 95 mg/dL (ref 0–149)
VLDL Cholesterol Cal: 17 mg/dL (ref 5–40)

## 2020-04-17 ENCOUNTER — Other Ambulatory Visit: Payer: Self-pay | Admitting: Physician Assistant

## 2020-04-17 ENCOUNTER — Ambulatory Visit (HOSPITAL_COMMUNITY)
Admission: RE | Admit: 2020-04-17 | Discharge: 2020-04-17 | Disposition: A | Discharge status: Home / Self Care | Payer: No Typology Code available for payment source | Source: Ambulatory Visit | Attending: Pulmonary Disease | Admitting: Pulmonary Disease

## 2020-04-17 DIAGNOSIS — U071 COVID-19: Secondary | ICD-10-CM

## 2020-04-17 DIAGNOSIS — J45909 Unspecified asthma, uncomplicated: Secondary | ICD-10-CM

## 2020-04-17 MED ORDER — ALBUTEROL SULFATE HFA 108 (90 BASE) MCG/ACT IN AERS: 2.0000 | INHALATION_SPRAY | Freq: Once | RESPIRATORY_TRACT | Status: DC | PRN

## 2020-04-17 MED ORDER — SODIUM CHLORIDE 0.9 % IV SOLN: INTRAVENOUS | Status: DC | PRN

## 2020-04-17 MED ORDER — METHYLPREDNISOLONE SODIUM SUCC 125 MG IJ SOLR: 125.0000 mg | Freq: Once | INTRAMUSCULAR | Status: DC | PRN

## 2020-04-17 MED ORDER — SODIUM CHLORIDE 0.9 % IV SOLN
1200.0000 mg | Freq: Once | INTRAVENOUS | Status: AC
  Administered 2020-04-17: 1200 mg via INTRAVENOUS
  Filled 2020-04-17: qty 10

## 2020-04-17 MED ORDER — DIPHENHYDRAMINE HCL 50 MG/ML IJ SOLN: 50.0000 mg | Freq: Once | INTRAMUSCULAR | Status: DC | PRN

## 2020-04-17 MED ORDER — EPINEPHRINE 0.3 MG/0.3ML IJ SOAJ: 0.3000 mg | Freq: Once | INTRAMUSCULAR | Status: DC | PRN

## 2020-04-17 MED ORDER — FAMOTIDINE IN NACL 20-0.9 MG/50ML-% IV SOLN: 20.0000 mg | Freq: Once | INTRAVENOUS | Status: DC | PRN

## 2020-04-17 NOTE — Progress Notes (Signed)
  Diagnosis: COVID-19  Physician: Dr. Patrick Wright  Procedure: Covid Infusion Clinic Med: casirivimab\imdevimab infusion - Provided patient with casirivimab\imdevimab fact sheet for patients, parents and caregivers prior to infusion.  Complications: No immediate complications noted.  Discharge: Discharged home   Weiland Tomich 04/17/2020   

## 2020-04-17 NOTE — Discharge Instructions (Signed)

## 2020-04-17 NOTE — Progress Notes (Signed)
I connected by phone with Hunter Foster on 04/17/2020 at 10:55 AM to discuss the potential use of a new treatment for mild to moderate COVID-19 viral infection in non-hospitalized patients.  This patient is a 39 y.o. male that meets the FDA criteria for Emergency Use Authorization of COVID monoclonal antibody casirivimab/imdevimab.  Has a (+) direct SARS-CoV-2 viral test result  Has mild or moderate COVID-19   Is NOT hospitalized due to COVID-19  Is within 10 days of symptom onset  Has at least one of the high risk factor(s) for progression to severe COVID-19 and/or hospitalization as defined in EUA.  Specific high risk criteria : Chronic Lung Disease   I have spoken and communicated the following to the patient or parent/caregiver regarding COVID monoclonal antibody treatment:  1. FDA has authorized the emergency use for the treatment of mild to moderate COVID-19 in adults and pediatric patients with positive results of direct SARS-CoV-2 viral testing who are 21 years of age and older weighing at least 40 kg, and who are at high risk for progressing to severe COVID-19 and/or hospitalization.  2. The significant known and potential risks and benefits of COVID monoclonal antibody, and the extent to which such potential risks and benefits are unknown.  3. Information on available alternative treatments and the risks and benefits of those alternatives, including clinical trials.  4. Patients treated with COVID monoclonal antibody should continue to self-isolate and use infection control measures (e.g., wear mask, isolate, social distance, avoid sharing personal items, clean and disinfect "high touch" surfaces, and frequent handwashing) according to CDC guidelines.   5. The patient or parent/caregiver has the option to accept or refuse COVID monoclonal antibody treatment.  After reviewing this information with the patient, The patient agreed to proceed with receiving casirivimab\imdevimab  infusion and will be provided a copy of the Fact sheet prior to receiving the infusion.  Sx onset 8/24. Set up for infusion on 8/25 @ 4pm. Directions given to [facility]. Pt is aware that insurance will be charged an infusion fee. Pt is not vaccinated.   Cline Crock 04/17/2020 10:55 AM

## 2020-07-29 ENCOUNTER — Encounter: Payer: Self-pay | Admitting: Family Medicine

## 2020-07-30 MED ORDER — OSELTAMIVIR PHOSPHATE 75 MG PO CAPS: 75.0000 mg | ORAL_CAPSULE | Freq: Two times a day (BID) | ORAL | 0 refills | Status: AC

## 2021-01-16 ENCOUNTER — Encounter: Payer: No Typology Code available for payment source | Admitting: Family Medicine

## 2021-04-17 ENCOUNTER — Encounter: Payer: Self-pay | Admitting: Nurse Practitioner

## 2021-04-17 ENCOUNTER — Other Ambulatory Visit: Payer: Self-pay

## 2021-04-17 ENCOUNTER — Ambulatory Visit (INDEPENDENT_AMBULATORY_CARE_PROVIDER_SITE_OTHER): Payer: No Typology Code available for payment source | Admitting: Nurse Practitioner

## 2021-04-17 VITALS — BP 111/71 | HR 73 | Temp 97.9°F | Resp 20 | Ht 73.0 in | Wt 187.0 lb

## 2021-04-17 DIAGNOSIS — W57XXXA Bitten or stung by nonvenomous insect and other nonvenomous arthropods, initial encounter: Secondary | ICD-10-CM

## 2021-04-17 DIAGNOSIS — S30860A Insect bite (nonvenomous) of lower back and pelvis, initial encounter: Secondary | ICD-10-CM

## 2021-04-17 MED ORDER — DOXYCYCLINE HYCLATE 100 MG PO TABS: 100.0000 mg | ORAL_TABLET | Freq: Two times a day (BID) | ORAL | 0 refills | Status: DC

## 2021-04-17 NOTE — Progress Notes (Signed)
   Subjective:    Patient ID: Hunter Foster, male    DOB: 1980-11-06, 40 y.o.   MRN: [id number]   Chief Complaint: ? tick on lower back   HPI Patient has a bite on right buttocks. Not sure if was tick or spider bite. Has gotten some better today. He feels sore all over    Review of Systems  Constitutional:  Negative for chills and fever.  Cardiovascular: Negative.   Gastrointestinal: Negative.   Musculoskeletal:  Positive for arthralgias.  Neurological: Negative.   Hematological: Negative.   All other systems reviewed and are negative.     Objective:   Physical Exam Vitals reviewed.  Constitutional:      Appearance: Normal appearance.  Cardiovascular:     Rate and Rhythm: Normal rate and regular rhythm.     Heart sounds: Normal heart sounds.  Pulmonary:     Effort: Pulmonary effort is normal.     Breath sounds: Normal breath sounds.  Skin:    General: Skin is warm.     Comments: 3cm annular lesion with central bite on right buttocks- slightly hard and sore to touch  Neurological:     General: No focal deficit present.     Mental Status: He is alert and oriented to person, place, and time.    BP 111/71   Pulse 73   Temp 97.9 F (36.6 C) (Temporal)   Resp 20   Ht 6\' 1"  (1.854 m)   Wt 187 lb (84.8 kg)   BMI 24.67 kg/m        Assessment & Plan:  in today with chief complaint of ? tick on lower back   1. Insect bite of lower back, initial encounter Avoid scratching or picking at area Cool compresses if needed Meds ordered this encounter  Medications   doxycycline (VIBRA-TABS) 100 MG tablet    Sig: Take 1 tablet (100 mg total) by mouth 2 (two) times daily. 1 po bid    Dispense:  20 tablet    Refill:  0    Order Specific Question:   Supervising Provider    Answer:   Guadalupe Dawn A [[id number]]   RTO prn    The above assessment and management plan was discussed with the patient. The patient verbalized understanding of and has  agreed to the management plan. Patient is aware to call the clinic if symptoms persist or worsen. Patient is aware when to return to the clinic for a follow-up visit. Patient educated on when it is appropriate to go to the emergency department.   Mary-Margaret Arville Care, FNP

## 2021-04-17 NOTE — Patient Instructions (Signed)
Insect Bite, Adult An insect bite can make your skin red, itchy, and swollen. Some insects can spread disease to people with a bite. However, most insect bites do not lead to disease, and most are not serious. What are the causes? Insects may bite for many reasons, including: Hunger. To defend themselves. Insects that bite include: Spiders. Mosquitoes. Ticks. Fleas. Ants. Flies. Kissing bugs. Chiggers. What are the signs or symptoms? Symptoms of this condition include: Itching or pain in the bite area. Redness and swelling in the bite area. An open wound (skin ulcer). Symptoms often last for 2-4 days. In rare cases, a person may have a very bad allergic reaction (anaphylactic reaction) to a bite. Symptoms of an anaphylactic reaction may include: Feeling warm in the face (flushed). Your face may turn red. Itchy, red, swollen areas of skin (hives). Swelling of the: Eyes. Lips. Face. Mouth. Tongue. Throat. Trouble with any of these: Breathing. Talking. Swallowing. Loud breathing (wheezing). Feeling dizzy or light-headed. Passing out (fainting). Pain or cramps in your belly. Throwing up (vomiting). Watery poop (diarrhea). How is this treated? Treatment is usually not needed. Symptoms often go away on their own. When treatment is needed, it may involve: Putting a cream or lotion on the bite area. This helps with itching. Taking an antibiotic medicine. This treatment is needed if the bite area gets infected. Getting a tetanus shot, if you are not up to date on this vaccine. Putting ice on the affected area. Using medicines called antihistamines. This treatment may be needed if you have itching or an allergic reaction to the insect bite. Giving yourself a shot of medicine (epinephrine) using an auto-injector "pen" if you have an anaphylactic reaction to a bite. Your doctor will teach you how to use this pen. Follow these instructions at home: Bite area care  Do not  scratch the bite area. Keep the bite area clean and dry. Wash the bite area every day with soap and water as told by your doctor. Check the bite area every day for signs of infection. Check for: Redness, swelling, or pain. Fluid or blood. Warmth. Pus or a bad smell. Managing pain, itching, and swelling  You may put any of these on the bite area as told by your doctor: A paste made of baking soda and water. Cortisone cream. Calamine lotion. If told, put ice on the bite area. Put ice in a plastic bag. Place a towel between your skin and the bag. Leave the ice on for 20 minutes, 2-3 times a day. General instructions Apply or take over-the-counter and prescription medicines only as told by your doctor. If you were prescribed an antibiotic medicine, take or apply it as told by your doctor. Do not stop using the antibiotic even if your condition improves. Keep all follow-up visits as told by your doctor. This is important. How is this prevented? To help you have a lower risk of insect bites: When you are outside, wear clothing that covers your arms and legs. Use insect repellent. The best insect repellents contain one of these: DEET. Picaridin. Oil of lemon eucalyptus (OLE). IR3535. Consider spraying your clothing with a pesticide called permethrin. Permethrin helps prevent insect bites. It works for several weeks and for up to 5-6 clothing washes. Do not apply permethrin directly to the skin. If your home windows do not have screens, think about putting some in. If you will be sleeping in an area where there are mosquitoes, consider covering your sleeping area with a mosquito   net. Contact a doctor if: You have redness, swelling, or pain in the bite area. You have fluid or blood coming from the bite area. The bite area feels warm to the touch. You have pus or a bad smell coming from the bite area. You have a fever. Get help right away if: You have joint pain. You have a rash. You  feel more tired or sleepy than you normally do. You have neck pain. You have a headache. You feel weaker than you normally do. You have signs of an anaphylactic reaction. Signs may include: Feeling warm in the face. Itchy, red, swollen areas of skin. Swelling of your: Eyes. Lips. Face. Mouth. Tongue. Throat. Trouble with any of these: Breathing. Talking. Swallowing. Loud breathing. Feeling dizzy or light-headed. Passing out. Pain or cramps in your belly. Throwing up. Watery poop. These symptoms may be an emergency. Do not wait to see if the symptoms will go away. Do this right away: Use your auto-injector pen as you have been told. Get medical help. Call your local emergency services (911 in the [place] Do not drive yourself to the hospital. Summary An insect bite can make your skin red, itchy, and swollen. Treatment is usually not needed. Symptoms often go away on their own. Do not scratch the bite area. Keep it clean and dry. Ice can help with pain and itching from the bite. This information is not intended to replace advice given to you by your health care provider. Make sure you discuss any questions you have with your health care provider. Document Revised: 07/02/2020 Document Reviewed: 02/18/2018 Elsevier Patient Education  2022 [employer]  

## 2021-05-20 ENCOUNTER — Other Ambulatory Visit: Payer: Self-pay

## 2021-05-20 ENCOUNTER — Encounter: Payer: Self-pay | Admitting: Cardiovascular Disease

## 2021-05-20 ENCOUNTER — Ambulatory Visit (INDEPENDENT_AMBULATORY_CARE_PROVIDER_SITE_OTHER): Payer: No Typology Code available for payment source | Admitting: Cardiovascular Disease

## 2021-05-20 VITALS — BP 100/72 | HR 70 | Ht 73.0 in | Wt 187.6 lb

## 2021-05-20 DIAGNOSIS — R079 Chest pain, unspecified: Secondary | ICD-10-CM

## 2021-05-20 NOTE — Progress Notes (Signed)
Cardiology Office Note:    Date:  05/20/2021   ID:  Hunter Foster, DOB 10/15/1980, MRN [id number]  PCP:  Dettinger, Elige Radon, MD   [facility] Providers Cardiologist:  None     Referring MD: Dettinger, Elige Radon, MD   Chief Complaint  Patient presents with   Chest Pain    History of Present Illness:    Hunter Foster is a 40 y.o. male presenting for evaluation of chest pain. He describes episodes of chest pain starting in the center of his chest, radiating to the right arm, and to the right neck. The symptoms have never radiated to the left side. The symptoms have not been related to physical exertion. Chest pains occur sporadically, up to twice in a week, then might not recur for 3 weeks. Symptoms generally last about an hour and might resolve on their own.  Pain starts as a pressure-like sensation in the center of the chest.  His most recent episode started when he was sitting on the couch resting.  He has taken Tums with some relief.  He has not associated his symptoms with any specific type of food.  He denies shortness of breath, palpitations, leg swelling, lightheadedness, or syncope.   Past Medical History:  Diagnosis Date   Asthma     Past Surgical History:  Procedure Laterality Date   WISDOM TOOTH EXTRACTION      Current Medications: No outpatient medications have been marked as taking for the 05/20/21 encounter (Office Visit) with Tonny Bollman, MD.     Allergies:   Patient has no known allergies.   Social History   Socioeconomic History   Marital status: Married    Spouse name: Not on file   Number of children: Not on file   Years of education: Not on file   Highest education level: Not on file  Occupational History   Not on file  Tobacco Use   Smoking status: Never   Smokeless tobacco: Never  Substance and Sexual Activity   Alcohol use: Yes    Comment: occasional   Drug use: No   Sexual activity: Not on file  Other Topics Concern   Not on  file  Social History Narrative   Not on file   Social Determinants of Health   Financial Resource Strain: Not on file  Food Insecurity: Not on file  Transportation Needs: Not on file  Physical Activity: Not on file  Stress: Not on file  Social Connections: Not on file     Family History: The patient's family history includes Alzheimer's disease in his paternal grandmother; Diabetes in his father; Heart attack in his father. Father - multiple stents, 'massive heart attack.'   ROS:   Please see the history of present illness.    Positive for low back pain radiating down the back of the right leg.  All other systems reviewed and are negative.  EKGs/Labs/Other Studies Reviewed:    EKG:  EKG is ordered today.  The ekg ordered today demonstrates NSR 61 bpm, nonspecific T wave abnormality.   Recent Labs: No results found for requested labs within last 8760 hours.  Recent Lipid Panel    Component Value Date/Time   CHOL 229 (H) 01/17/2020 1018   TRIG 95 01/17/2020 1018   HDL 53 01/17/2020 1018   CHOLHDL 4.3 01/17/2020 1018   LDLCALC 159 (H) 01/17/2020 1018     Risk Assessment/Calculations:           Physical Exam:  VS:  BP 100/72   Pulse 70   Ht 6\' 1"  (1.854 m)   Wt 187 lb 9.6 oz (85.1 kg)   SpO2 98%   BMI 24.75 kg/m     Wt Readings from Last 3 Encounters:  05/20/21 187 lb 9.6 oz (85.1 kg)  04/17/21 187 lb (84.8 kg)  01/17/20 176 lb (79.8 kg)     GEN:  Well nourished, well developed in no acute distress HEENT: Normal NECK: No JVD; No carotid bruits LYMPHATICS: No lymphadenopathy CARDIAC: RRR, no murmurs, rubs, gallops RESPIRATORY:  Clear to auscultation without rales, wheezing or rhonchi  ABDOMEN: Soft, non-tender, non-distended MUSCULOSKELETAL:  No edema; No deformity  SKIN: Warm and dry NEUROLOGIC:  Alert and oriented x 3 PSYCHIATRIC:  Normal affect   ASSESSMENT:    1. Chest pain at rest    PLAN:    In order of problems listed above:  The  patient has chest pain with typical and atypical features.  He does have a family history of coronary artery disease in his father.  It seems reasonable to proceed with stress testing.  He has some nonspecific T wave changes but I do not think his baseline EKG abnormalities are prohibitive for performing exercise EKG testing.  I have recommended a nonimaging exercise EKG study to evaluate chest pain with concern for ischemic heart disease.  We will follow-up with him after his stress test is available.  I suspect his symptoms are noncardiac.   Shared Decision Making/Informed Consent The risks [chest pain, shortness of breath, cardiac arrhythmias, dizziness, blood pressure fluctuations, myocardial infarction, stroke/transient ischemic attack, and life-threatening complications (estimated to be 1 in 10,000)], benefits (risk stratification, diagnosing coronary artery disease, treatment guidance) and alternatives of an exercise tolerance test were discussed in detail with Mr. Pursley and he agrees to proceed.    Medication Adjustments/Labs and Tests Ordered: Current medicines are reviewed at length with the patient today.  Concerns regarding medicines are outlined above.  No orders of the defined types were placed in this encounter.  No orders of the defined types were placed in this encounter.   There are no Patient Instructions on file for this visit.   Signed, Willette Brace, MD  05/20/2021 9:03 AM    [facility]

## 2021-05-20 NOTE — Patient Instructions (Signed)
Medication Instructions:   Your physician recommends that you continue on your current medications as directed. Please refer to the Current Medication list given to you today.  *If you need a refill on your cardiac medications before your next appointment, please call your pharmacy*   Testing/Procedures:  Your physician has requested that you have an exercise tolerance test. For further information please visit [email or web address] Please also follow instruction sheet, as given.   Follow-Up:  AS NEEDED WITH DR. Excell Seltzer

## 2021-05-29 ENCOUNTER — Ambulatory Visit (INDEPENDENT_AMBULATORY_CARE_PROVIDER_SITE_OTHER): Payer: No Typology Code available for payment source

## 2021-05-29 ENCOUNTER — Other Ambulatory Visit: Payer: Self-pay

## 2021-05-29 DIAGNOSIS — R079 Chest pain, unspecified: Secondary | ICD-10-CM | POA: Diagnosis not present

## 2021-05-29 LAB — EXERCISE TOLERANCE TEST
Angina Index: 0
Duke Treadmill Score: 11
Estimated workload: 13.4
Exercise duration (min): 11 min
Exercise duration (sec): 0 s
MPHR: 182 {beats}/min
Peak HR: 160 {beats}/min
Percent HR: 87 %
RPE: 15
Rest HR: 85 {beats}/min
ST Depression (mm): 0 mm

## 2021-06-05 ENCOUNTER — Ambulatory Visit (INDEPENDENT_AMBULATORY_CARE_PROVIDER_SITE_OTHER): Payer: No Typology Code available for payment source | Admitting: Family

## 2021-06-05 ENCOUNTER — Other Ambulatory Visit: Payer: Self-pay

## 2021-06-05 ENCOUNTER — Encounter: Payer: Self-pay | Admitting: Family

## 2021-06-05 VITALS — BP 127/81 | HR 94 | Temp 97.8°F | Resp 20 | Ht 73.0 in | Wt 187.0 lb

## 2021-06-05 DIAGNOSIS — M5442 Lumbago with sciatica, left side: Secondary | ICD-10-CM

## 2021-06-05 DIAGNOSIS — M5441 Lumbago with sciatica, right side: Secondary | ICD-10-CM | POA: Diagnosis not present

## 2021-06-05 DIAGNOSIS — L232 Allergic contact dermatitis due to cosmetics: Secondary | ICD-10-CM

## 2021-06-05 MED ORDER — TRIAMCINOLONE ACETONIDE 0.5 % EX OINT: 1.0000 "application " | TOPICAL_OINTMENT | Freq: Two times a day (BID) | CUTANEOUS | 0 refills | Status: DC

## 2021-06-05 MED ORDER — PREDNISONE 10 MG (21) PO TBPK: ORAL_TABLET | ORAL | 0 refills | Status: DC

## 2021-06-05 MED ORDER — DICLOFENAC SODIUM 75 MG PO TBEC: 75.0000 mg | DELAYED_RELEASE_TABLET | Freq: Two times a day (BID) | ORAL | 0 refills | Status: DC

## 2021-06-05 NOTE — Progress Notes (Signed)
Subjective:    Patient ID: Hunter Foster, male    DOB: Jun 12, 1981, 40 y.o.   MRN: [id number]  Chief Complaint  Patient presents with   Rash   Back Pain    Rash This is a new problem. The current episode started 1 to 4 weeks ago. The problem has been gradually worsening since onset. Location: bilateral axilla. The rash is characterized by itchiness, redness and pain. He was exposed to nothing. Pertinent negatives include no congestion, cough, diarrhea, fatigue, fever, shortness of breath or sore throat. Past treatments include anti-itch cream and topical steroids (benadryl). The treatment provided mild relief.  Back Pain This is a new problem. The current episode started 1 to 4 weeks ago. The problem occurs intermittently. The pain is present in the lumbar spine. The quality of the pain is described as aching. The pain radiates to the left thigh and right thigh. The pain is at a severity of 9/10. The pain is moderate. The symptoms are aggravated by twisting and lying down. Associated symptoms include leg pain. Pertinent negatives include no fever, paresthesias, pelvic pain or perianal numbness. He has tried NSAIDs for the symptoms. The treatment provided mild relief.     Review of Systems  Constitutional:  Negative for fatigue and fever.  HENT:  Negative for congestion and sore throat.   Respiratory:  Negative for cough and shortness of breath.   Gastrointestinal:  Negative for diarrhea.  Genitourinary:  Negative for pelvic pain.  Musculoskeletal:  Positive for back pain.  Skin:  Positive for rash.  Neurological:  Negative for paresthesias.  All other systems reviewed and are negative.     Objective:   Physical Exam Vitals reviewed.  Constitutional:      General: He is not in acute distress.    Appearance: He is well-developed.  HENT:     Head: Normocephalic.     Right Ear: Tympanic membrane normal.     Left Ear: Tympanic membrane normal.  Eyes:     General:        Right  eye: No discharge.        Left eye: No discharge.     Pupils: Pupils are equal, round, and reactive to light.  Neck:     Thyroid: No thyromegaly.  Cardiovascular:     Rate and Rhythm: Normal rate and regular rhythm.     Heart sounds: Normal heart sounds. No murmur heard. Pulmonary:     Effort: Pulmonary effort is normal. No respiratory distress.     Breath sounds: Normal breath sounds. No wheezing.  Abdominal:     General: Bowel sounds are normal. There is no distension.     Palpations: Abdomen is soft.     Tenderness: There is no abdominal tenderness.  Musculoskeletal:        General: No tenderness. Normal range of motion.     Cervical back: Normal range of motion and neck supple.  Skin:    General: Skin is warm and dry.     Findings: Erythema and rash present.          Comments: Erythemas and peeling in bilateral axilla   Neurological:     Mental Status: He is alert and oriented to person, place, and time.     Cranial Nerves: No cranial nerve deficit.     Deep Tendon Reflexes: Reflexes are normal and symmetric.  Psychiatric:        Behavior: Behavior normal.        Thought  Content: Thought content normal.        Judgment: Judgment normal.      BP 127/81   Pulse 94   Temp 97.8 F (36.6 C) (Temporal)   Resp 20   Ht 6\' 1"  (1.854 m)   Wt 187 lb (84.8 kg)   SpO2 98%   BMI 24.67 kg/m      Assessment & Plan:  Hunter Foster comes in today with chief complaint of Rash and Back Pain   Diagnosis and orders addressed:  1. Acute bilateral low back pain with bilateral sciatica Rest Ice  ROM exercises encouraged No other NSAID's while taking diclofenac  - diclofenac (VOLTAREN) 75 MG EC tablet; Take 1 tablet (75 mg total) by mouth 2 (two) times daily.  Dispense: 30 tablet; Refill: 0 - predniSONE (STERAPRED UNI-PAK 21 TAB) 10 MG (21) TBPK tablet; Use as directed  Dispense: 21 tablet; Refill: 0  2. Allergic contact dermatitis due to cosmetics Change deodorant Cool  compress  Avoid scratching   - triamcinolone ointment (KENALOG) 0.5 %; Apply 1 application topically 2 (two) times daily.  Dispense: 60 g; Refill: 0    Guadalupe Dawn, FNP

## 2021-06-05 NOTE — Patient Instructions (Signed)
Sciatica Rehab °Ask your health care provider which exercises are safe for you. Do exercises exactly as told by your health care provider and adjust them as directed. It is normal to feel mild stretching, pulling, tightness, or discomfort as you do these exercises. Stop right away if you feel sudden pain or your pain gets worse. Do not begin these exercises until told by your health care provider. °Stretching and range-of-motion exercises °These exercises warm up your muscles and joints and improve the movement and flexibility of your hips and back. These exercises also help to relieve pain, numbness, and tingling. °Sciatic nerve glide °Sit in a chair with your head facing down toward your chest. Place your hands behind your back. Let your shoulders slump forward. °Slowly straighten one of your legs while you tilt your head back as if you are looking toward the ceiling. Only straighten your leg as far as you can without making your symptoms worse. °Hold this position for __________ seconds. °Slowly return to the starting position. °Repeat with your other leg. °Repeat __________ times. Complete this exercise __________ times a day. °Knee to chest with hip adduction and internal rotation ° °Lie on your back on a firm surface with both legs straight. °Bend one of your knees and move it up toward your chest until you feel a gentle stretch in your lower back and buttock. Then, move your knee toward the shoulder that is on the opposite side from your leg. This is hip adduction and internal rotation. °Hold your leg in this position by holding on to the front of your knee. °Hold this position for __________ seconds. °Slowly return to the starting position. °Repeat with your other leg. °Repeat __________ times. Complete this exercise __________ times a day. °Prone extension on elbows ° °Lie on your abdomen on a firm surface. A bed may be too soft for this exercise. °Prop yourself up on your elbows. °Use your arms to help  lift your chest up until you feel a gentle stretch in your abdomen and your lower back. °This will place some of your body weight on your elbows. If this is uncomfortable, try stacking pillows under your chest. °Your hips should stay down, against the surface that you are lying on. Keep your hip and back muscles relaxed. °Hold this position for __________ seconds. °Slowly relax your upper body and return to the starting position. °Repeat __________ times. Complete this exercise __________ times a day. °Strengthening exercises °These exercises build strength and endurance in your back. Endurance is the ability to use your muscles for a long time, even after they get tired. °Pelvic tilt °This exercise strengthens the muscles that lie deep in the abdomen. °Lie on your back on a firm surface. Bend your knees and keep your feet flat on the floor. °Tense your abdominal muscles. Tip your pelvis up toward the ceiling and flatten your lower back into the floor. °To help with this exercise, you may place a small towel under your lower back and try to push your back into the towel. °Hold this position for __________ seconds. °Let your muscles relax completely before you repeat this exercise. °Repeat __________ times. Complete this exercise __________ times a day. °Alternating arm and leg raises ° °Get on your hands and knees on a firm surface. If you are on a hard floor, you may want to use padding, such as an exercise mat, to cushion your knees. °Line up your arms and legs. Your hands should be directly below your shoulders,   and your knees should be directly below your hips. °Lift your left leg behind you. At the same time, raise your right arm and straighten it in front of you. °Do not lift your leg higher than your hip. °Do not lift your arm higher than your shoulder. °Keep your abdominal and back muscles tight. °Keep your hips facing the ground. °Do not arch your back. °Keep your balance carefully, and do not hold your  breath. °Hold this position for __________ seconds. °Slowly return to the starting position. °Repeat with your right leg and your left arm. °Repeat __________ times. Complete this exercise __________ times a day. °Posture and body mechanics °Good posture and healthy body mechanics can help to relieve stress in your body's tissues and joints. Body mechanics refers to the movements and positions of your body while you do your daily activities. Posture is part of body mechanics. Good posture means: °Your spine is in its natural S-curve position (neutral). °Your shoulders are pulled back slightly. °Your head is not tipped forward. °Follow these guidelines to improve your posture and body mechanics in your everyday activities. °Standing ° °When standing, keep your spine neutral and your feet about hip width apart. Keep a slight bend in your knees. Your ears, shoulders, and hips should line up. °When you do a task in which you stand in one place for a long time, place one foot up on a stable object that is 2-4 inches (5-10 cm) high, such as a footstool. This helps keep your spine neutral. °Sitting ° °When sitting, keep your spine neutral and keep your feet flat on the floor. Use a footrest, if necessary, and keep your thighs parallel to the floor. Avoid rounding your shoulders, and avoid tilting your head forward. °When working at a desk or a computer, keep your desk at a height where your hands are slightly lower than your elbows. Slide your chair under your desk so you are close enough to maintain good posture. °When working at a computer, place your monitor at a height where you are looking straight ahead and you do not have to tilt your head forward or downward to look at the screen. °Resting °When lying down and resting, avoid positions that are most painful for you. °If you have pain with activities such as sitting, bending, stooping, or squatting, lie in a position in which your body does not bend very much. For  example, avoid curling up on your side with your arms and knees near your chest (fetal position). °If you have pain with activities such as standing for a long time or reaching with your arms, lie with your spine in a neutral position and bend your knees slightly. Try the following positions: °Lying on your side with a pillow between your knees. °Lying on your back with a pillow under your knees. °Lifting ° °When lifting objects, keep your feet at least shoulder width apart and tighten your abdominal muscles. °Bend your knees and hips and keep your spine neutral. It is important to lift using the strength of your legs, not your back. Do not lock your knees straight out. °Always ask for help to lift heavy or awkward objects. °This information is not intended to replace advice given to you by your health care provider. Make sure you discuss any questions you have with your health care provider. °Document Revised: 12/02/2018 Document Reviewed: 09/01/2018 °Elsevier Patient Education © 2022 [employer] Inc. ° °

## 2021-07-28 ENCOUNTER — Other Ambulatory Visit (HOSPITAL_BASED_OUTPATIENT_CLINIC_OR_DEPARTMENT_OTHER): Payer: Self-pay

## 2021-07-28 MED ORDER — CARESTART COVID-19 HOME TEST VI KIT
PACK | 0 refills | Status: DC
Start: 2021-07-28 — End: 2021-08-07
  Filled 2021-07-28: qty 4, 4d supply, fill #0

## 2021-08-07 ENCOUNTER — Encounter: Payer: Self-pay | Admitting: Nurse Practitioner

## 2021-08-07 ENCOUNTER — Ambulatory Visit (INDEPENDENT_AMBULATORY_CARE_PROVIDER_SITE_OTHER): Payer: No Typology Code available for payment source | Admitting: Nurse Practitioner

## 2021-08-07 VITALS — BP 122/64 | HR 80 | Temp 97.3°F | Resp 20 | Ht 73.0 in | Wt 191.0 lb

## 2021-08-07 DIAGNOSIS — H6123 Impacted cerumen, bilateral: Secondary | ICD-10-CM | POA: Diagnosis not present

## 2021-08-07 DIAGNOSIS — Z23 Encounter for immunization: Secondary | ICD-10-CM | POA: Diagnosis not present

## 2021-08-07 NOTE — Progress Notes (Signed)
° °  Subjective:    Patient ID: Hunter Foster, male    DOB: May 04, 1981, 40 y.o.   MRN: [id number]   Chief Complaint: Ears feel stopped up   HPI patient comes. Started over a week ago. No drainage.in  today c/o feeling like ears are stopped up. He has tried flushing ear canals out at home with no success.     Review of Systems  Constitutional:  Negative for chills, fatigue and fever.  HENT:  Positive for ear pain.   Respiratory: Negative.    Cardiovascular: Negative.   Neurological: Negative.   Hematological: Negative.   All other systems reviewed and are negative.     Objective:   Physical Exam Vitals reviewed.  Constitutional:      Appearance: Normal appearance.  HENT:     Right Ear: There is impacted cerumen.     Left Ear: There is impacted cerumen.     Nose: Nose normal.  Cardiovascular:     Rate and Rhythm: Normal rate and regular rhythm.     Heart sounds: Normal heart sounds.  Pulmonary:     Effort: Pulmonary effort is normal.     Breath sounds: Normal breath sounds.  Skin:    General: Skin is warm.  Neurological:     General: No focal deficit present.     Mental Status: He is alert and oriented to person, place, and time.    BP 122/64    Pulse 80    Temp (!) 97.3 F (36.3 C) (Temporal)    Resp 20    Ht 6\' 1"  (1.854 m)    Wt 191 lb (86.6 kg)    SpO2 99%    BMI 25.20 kg/m   S/P bil ear lavage- TM's normal      Assessment & Plan:   in today with chief complaint of Ears feel stopped up   1. Bilateral impacted cerumen Debrox over the counter a couple of times a week Do not use q tips in ears RTO prn    The above assessment and management plan was discussed with the patient. The patient verbalized understanding of and has agreed to the management plan. Patient is aware to call the clinic if symptoms persist or worsen. Patient is aware when to return to the clinic for a follow-up visit. Patient educated on when it is appropriate to go  to the emergency department.   Mary-Margaret Guadalupe Dawn, FNP

## 2021-08-07 NOTE — Patient Instructions (Signed)
Earwax Buildup, Adult ?The ears produce a substance called earwax that helps keep bacteria out of the ear and protects the skin in the ear canal. Occasionally, earwax can build up in the ear and cause discomfort or hearing loss. ?What are the causes? ?This condition is caused by a buildup of earwax. Ear canals are self-cleaning. Ear wax is made in the outer part of the ear canal and generally falls out in small amounts over time. ?When the self-cleaning mechanism is not working, earwax builds up and can cause decreased hearing and discomfort. Attempting to clean ears with cotton swabs can push the earwax deep into the ear canal and cause decreased hearing and pain. ?What increases the risk? ?This condition is more likely to develop in people who: ?Clean their ears often with cotton swabs. ?Pick at their ears. ?Use earplugs or in-ear headphones often, or wear hearing aids. ?The following factors may also make you more likely to develop this condition: ?Being male. ?Being of older age. ?Naturally producing more earwax. ?Having narrow ear canals. ?Having earwax that is overly thick or sticky. ?Having excess hair in the ear canal. ?Having eczema. ?Being dehydrated. ?What are the signs or symptoms? ?Symptoms of this condition include: ?Reduced or muffled hearing. ?A feeling of fullness in the ear or feeling that the ear is plugged. ?Fluid coming from the ear. ?Ear pain or an itchy ear. ?Ringing in the ear. ?Coughing. ?Balance problems. ?An obvious piece of earwax that can be seen inside the ear canal. ?How is this diagnosed? ?This condition may be diagnosed based on: ?Your symptoms. ?Your medical history. ?An ear exam. During the exam, your health care provider will look into your ear with an instrument called an otoscope. ?You may have tests, including a hearing test. ?How is this treated? ?This condition may be treated by: ?Using ear drops to soften the earwax. ?Having the earwax removed by a health care provider. The  health care provider may: ?Flush the ear with water. ?Use an instrument that has a loop on the end (curette). ?Use a suction device. ?Having surgery to remove the wax buildup. This may be done in severe cases. ?Follow these instructions at home: ? ?Take over-the-counter and prescription medicines only as told by your health care provider. ?Do not put any objects, including cotton swabs, into your ear. You can clean the opening of your ear canal with a washcloth or facial tissue. ?Follow instructions from your health care provider about cleaning your ears. Do not overclean your ears. ?Drink enough fluid to keep your urine pale yellow. This will help to thin the earwax. ?Keep all follow-up visits as told. If earwax builds up in your ears often or if you use hearing aids, consider seeing your health care provider for routine, preventive ear cleanings. Ask your health care provider how often you should schedule your cleanings. ?If you have hearing aids, clean them according to instructions from the manufacturer and your health care provider. ?Contact a health care provider if: ?You have ear pain. ?You develop a fever. ?You have pus or other fluid coming from your ear. ?You have hearing loss. ?You have ringing in your ears that does not go away. ?You feel like the room is spinning (vertigo). ?Your symptoms do not improve with treatment. ?Get help right away if: ?You have bleeding from the affected ear. ?You have severe ear pain. ?Summary ?Earwax can build up in the ear and cause discomfort or hearing loss. ?The most common symptoms of this condition include   reduced or muffled hearing, a feeling of fullness in the ear, or feeling that the ear is plugged. ?This condition may be diagnosed based on your symptoms, your medical history, and an ear exam. ?This condition may be treated by using ear drops to soften the earwax or by having the earwax removed by a health care provider. ?Do not put any objects, including cotton  swabs, into your ear. You can clean the opening of your ear canal with a washcloth or facial tissue. ?This information is not intended to replace advice given to you by your health care provider. Make sure you discuss any questions you have with your health care provider. ?Document Revised: 11/28/2019 Document Reviewed: 11/28/2019 ?Elsevier Patient Education ? 2022 [employer]vier Inc. ? ?

## 2022-10-25 ENCOUNTER — Encounter (HOSPITAL_BASED_OUTPATIENT_CLINIC_OR_DEPARTMENT_OTHER): Payer: Self-pay

## 2022-10-25 ENCOUNTER — Emergency Department (HOSPITAL_BASED_OUTPATIENT_CLINIC_OR_DEPARTMENT_OTHER)
Admission: EM | Admit: 2022-10-25 | Discharge: 2022-10-25 | Disposition: A | Discharge status: Home / Self Care | Payer: 59 | Attending: Emergency Medicine | Admitting: Emergency Medicine

## 2022-10-25 ENCOUNTER — Emergency Department (HOSPITAL_BASED_OUTPATIENT_CLINIC_OR_DEPARTMENT_OTHER): Payer: 59

## 2022-10-25 ENCOUNTER — Other Ambulatory Visit: Payer: Self-pay

## 2022-10-25 DIAGNOSIS — R112 Nausea with vomiting, unspecified: Secondary | ICD-10-CM | POA: Insufficient documentation

## 2022-10-25 DIAGNOSIS — K828 Other specified diseases of gallbladder: Secondary | ICD-10-CM | POA: Diagnosis not present

## 2022-10-25 DIAGNOSIS — R1013 Epigastric pain: Secondary | ICD-10-CM | POA: Diagnosis not present

## 2022-10-25 DIAGNOSIS — R1011 Right upper quadrant pain: Secondary | ICD-10-CM | POA: Diagnosis not present

## 2022-10-25 DIAGNOSIS — R079 Chest pain, unspecified: Secondary | ICD-10-CM | POA: Diagnosis not present

## 2022-10-25 DIAGNOSIS — K8050 Calculus of bile duct without cholangitis or cholecystitis without obstruction: Secondary | ICD-10-CM

## 2022-10-25 DIAGNOSIS — R0789 Other chest pain: Secondary | ICD-10-CM | POA: Diagnosis not present

## 2022-10-25 DIAGNOSIS — K819 Cholecystitis, unspecified: Secondary | ICD-10-CM

## 2022-10-25 DIAGNOSIS — R61 Generalized hyperhidrosis: Secondary | ICD-10-CM | POA: Insufficient documentation

## 2022-10-25 LAB — CBC WITH DIFFERENTIAL/PLATELET
Abs Immature Granulocytes: 0.02 10*3/uL (ref 0.00–0.07)
Basophils Absolute: 0 10*3/uL (ref 0.0–0.1)
Basophils Relative: 0 %
Eosinophils Absolute: 0.2 10*3/uL (ref 0.0–0.5)
Eosinophils Relative: 2 %
HCT: 46.7 % (ref 39.0–52.0)
Hemoglobin: 16.4 g/dL (ref 13.0–17.0)
Immature Granulocytes: 0 %
Lymphocytes Relative: 38 %
Lymphs Abs: 3.5 10*3/uL (ref 0.7–4.0)
MCH: 32.7 pg (ref 26.0–34.0)
MCHC: 35.1 g/dL (ref 30.0–36.0)
MCV: 93 fL (ref 80.0–100.0)
Monocytes Absolute: 0.9 10*3/uL (ref 0.1–1.0)
Monocytes Relative: 10 %
Neutro Abs: 4.6 10*3/uL (ref 1.7–7.7)
Neutrophils Relative %: 50 %
Platelets: 285 10*3/uL (ref 150–400)
RBC: 5.02 MIL/uL (ref 4.22–5.81)
RDW: 11.9 % (ref 11.5–15.5)
WBC: 9.2 10*3/uL (ref 4.0–10.5)
nRBC: 0 % (ref 0.0–0.2)

## 2022-10-25 LAB — TROPONIN I (HIGH SENSITIVITY): Troponin I (High Sensitivity): <2 ng/L (ref <18)

## 2022-10-25 LAB — COMPREHENSIVE METABOLIC PANEL
ALT: 36 U/L (ref 0–44)
AST: 32 U/L (ref 15–41)
Albumin: 4.5 g/dL (ref 3.5–5.0)
Alkaline Phosphatase: 51 U/L (ref 38–126)
Anion gap: 8 (ref 5–15)
BUN: 11 mg/dL (ref 6–20)
CO2: 27 mmol/L (ref 22–32)
Calcium: 9.3 mg/dL (ref 8.9–10.3)
Chloride: 100 mmol/L (ref 98–111)
Creatinine, Ser: 1.11 mg/dL (ref 0.61–1.24)
GFR, Estimated: >60 mL/min (ref >60)
Glucose, Bld: 121 mg/dL — ABNORMAL HIGH (ref 70–99)
Potassium: 3.4 mmol/L — ABNORMAL LOW (ref 3.5–5.1)
Sodium: 135 mmol/L (ref 135–145)
Total Bilirubin: 1 mg/dL (ref 0.3–1.2)
Total Protein: 7.7 g/dL (ref 6.5–8.1)

## 2022-10-25 LAB — LIPASE, BLOOD: Lipase: 29 U/L (ref 11–51)

## 2022-10-25 MED ORDER — SODIUM CHLORIDE 0.9 % IV SOLN
2.0000 g | Freq: Once | INTRAVENOUS | Status: AC
  Administered 2022-10-25: 2 g via INTRAVENOUS
  Filled 2022-10-25: qty 20

## 2022-10-25 MED ORDER — OMEPRAZOLE 20 MG PO CPDR: 20.0000 mg | DELAYED_RELEASE_CAPSULE | Freq: Every day | ORAL | 0 refills | Status: DC

## 2022-10-25 MED ORDER — SODIUM CHLORIDE 0.9 % IV BOLUS
500.0000 mL | Freq: Once | INTRAVENOUS | Status: AC
  Administered 2022-10-25: 500 mL via INTRAVENOUS

## 2022-10-25 MED ORDER — ONDANSETRON HCL 4 MG/2ML IJ SOLN
4.0000 mg | Freq: Once | INTRAMUSCULAR | Status: AC
  Administered 2022-10-25: 4 mg via INTRAVENOUS
  Filled 2022-10-25: qty 2

## 2022-10-25 MED ORDER — FENTANYL CITRATE PF 50 MCG/ML IJ SOSY
50.0000 ug | PREFILLED_SYRINGE | Freq: Once | INTRAMUSCULAR | Status: DC
  Filled 2022-10-25: qty 1

## 2022-10-25 MED ORDER — SODIUM CHLORIDE 0.9 % IV SOLN: INTRAVENOUS | Status: DC | PRN

## 2022-10-25 MED ORDER — LACTATED RINGERS IV BOLUS: 1000.0000 mL | Freq: Once | INTRAVENOUS | Status: DC

## 2022-10-25 MED ORDER — SODIUM CHLORIDE 0.9 % IV SOLN: INTRAVENOUS | Status: DC

## 2022-10-25 MED ORDER — IOHEXOL 350 MG/ML SOLN
100.0000 mL | Freq: Once | INTRAVENOUS | Status: AC | PRN
  Administered 2022-10-25: 100 mL via INTRAVENOUS

## 2022-10-25 MED ORDER — FAMOTIDINE IN NACL 20-0.9 MG/50ML-% IV SOLN
20.0000 mg | Freq: Once | INTRAVENOUS | Status: AC
  Administered 2022-10-25: 20 mg via INTRAVENOUS
  Filled 2022-10-25: qty 50

## 2022-10-25 MED ORDER — HYDROCODONE-ACETAMINOPHEN 5-325 MG PO TABS: 1.0000 | ORAL_TABLET | Freq: Four times a day (QID) | ORAL | 0 refills | Status: DC | PRN

## 2022-10-25 MED ORDER — ALUM & MAG HYDROXIDE-SIMETH 200-200-20 MG/5ML PO SUSP
30.0000 mL | Freq: Once | ORAL | Status: AC
  Administered 2022-10-25: 30 mL via ORAL
  Filled 2022-10-25: qty 30

## 2022-10-25 MED ORDER — DICYCLOMINE HCL 10 MG/ML IM SOLN: 20.0000 mg | Freq: Once | INTRAMUSCULAR | Status: DC

## 2022-10-25 NOTE — ED Notes (Signed)
Pt discharged to home. Discharge instructions have been discussed with patient and/or family members. Pt verbally acknowledges understanding d/c instructions, and endorses comprehension to checkout at registration before leaving.  °

## 2022-10-25 NOTE — Discharge Instructions (Addendum)
1.  Schedule an appointment for recheck with your family doctor within the next several days.  Schedule follow-up with general surgery within the next days to week. 2.  Start taking omeprazole daily.  Review instructions for gastritis and follow recommendations for dietary management and lifestyle changes. 3.  You must follow a very low-fat diet to avoid a gallbladder attack.  Review instructions with cholecystitis and return if you get worsening pain, fever or other concerning changes.

## 2022-10-25 NOTE — ED Notes (Signed)
Per EDP order, pt given fluids and/or food for PO challenge. Pt verbalized understanding to utilize call bell if nausea or emesis occur.

## 2022-10-25 NOTE — ED Provider Notes (Signed)
[facility] Provider Note   CSN: [id number] Arrival date & time: 10/25/22  [id number]     History  Chief Complaint  Patient presents with   Chest Pain    Hunter Foster is a 42 y.o. male.  The history is provided by the patient and the spouse.  Chest Pain Chest pain location: subxiphoid. Pain quality: pressure   Pain quality: not sharp   Pain radiates to:  Mid back Pain severity:  Severe Onset quality:  Sudden Duration:  4 hours Timing:  Constant Progression:  Unchanged Chronicity:  Recurrent Context: at rest   Relieved by:  Nothing Worsened by:  Nothing Associated symptoms: nausea and vomiting   Associated symptoms: no cough and no fever   Risk factors: male sex   Patient with no significant past medical or surgical history presents with epigastric pain that radiated to back.  Patient ate friend catfish and clams at 4 pm and had abdominal pain and then one episode of diarrhea and took tums for this.  He took more tums before bed and then went to bed.  Awoke with subxiphoid pain to the mid back.  He has had belching and emesis with this episode.  Patient reports multiple episodes of this in the past and had a stress test for this and it was negative.       Home Medications Prior to Admission medications   Not on File      Allergies    Patient has no known allergies.    Review of Systems   Review of Systems  Constitutional:  Negative for fever.  HENT:  Negative for sore throat.   Respiratory:  Negative for cough.   Cardiovascular:  Positive for chest pain.  Gastrointestinal:  Positive for diarrhea, nausea and vomiting.       Belching  All other systems reviewed and are negative.   Physical Exam Updated Vital Signs BP (!) 138/94   Pulse 67   Resp 13   Ht '6\' 1"'$  (1.854 m)   Wt 83.9 kg   SpO2 99%   BMI 24.41 kg/m  Physical Exam Vitals and nursing note reviewed. Exam conducted with a chaperone present.   Constitutional:      General: He is not in acute distress.    Appearance: Normal appearance. He is well-developed. He is not diaphoretic.  HENT:     Head: Normocephalic and atraumatic.     Nose: Nose normal.  Eyes:     Extraocular Movements: Extraocular movements intact.     Conjunctiva/sclera: Conjunctivae normal.  Cardiovascular:     Rate and Rhythm: Normal rate and regular rhythm.     Pulses: Normal pulses.     Heart sounds: Normal heart sounds.  Pulmonary:     Effort: Pulmonary effort is normal.     Breath sounds: Normal breath sounds. No wheezing or rales.  Abdominal:     General: Bowel sounds are normal.     Palpations: Abdomen is soft.     Tenderness: There is no abdominal tenderness. There is no guarding or rebound.  Musculoskeletal:        General: Normal range of motion.     Cervical back: Normal range of motion and neck supple.  Skin:    General: Skin is warm and dry.     Capillary Refill: Capillary refill takes less than 2 seconds.  Neurological:     General: No focal deficit present.     Mental Status:  He is alert and oriented to person, place, and time.  Psychiatric:        Mood and Affect: Mood normal.        Behavior: Behavior normal.     ED Results / Procedures / Treatments   Labs (all labs ordered are listed, but only abnormal results are displayed) Results for orders placed or performed during the hospital encounter of 10/25/22  CBC with Differential  Result Value Ref Range   WBC 9.2 4.0 - 10.5 K/uL   RBC 5.02 4.22 - 5.81 MIL/uL   Hemoglobin 16.4 13.0 - 17.0 g/dL   HCT 46.7 39.0 - 52.0 %   MCV 93.0 80.0 - 100.0 fL   MCH 32.7 26.0 - 34.0 pg   MCHC 35.1 30.0 - 36.0 g/dL   RDW 11.9 11.5 - 15.5 %   Platelets 285 150 - 400 K/uL   nRBC 0.0 0.0 - 0.2 %   Neutrophils Relative % 50 %   Neutro Abs 4.6 1.7 - 7.7 K/uL   Lymphocytes Relative 38 %   Lymphs Abs 3.5 0.7 - 4.0 K/uL   Monocytes Relative 10 %   Monocytes Absolute 0.9 0.1 - 1.0 K/uL    Eosinophils Relative 2 %   Eosinophils Absolute 0.2 0.0 - 0.5 K/uL   Basophils Relative 0 %   Basophils Absolute 0.0 0.0 - 0.1 K/uL   Immature Granulocytes 0 %   Abs Immature Granulocytes 0.02 0.00 - 0.07 K/uL  Comprehensive metabolic panel  Result Value Ref Range   Sodium 135 135 - 145 mmol/L   Potassium 3.4 (L) 3.5 - 5.1 mmol/L   Chloride 100 98 - 111 mmol/L   CO2 27 22 - 32 mmol/L   Glucose, Bld 121 (H) 70 - 99 mg/dL   BUN 11 6 - 20 mg/dL   Creatinine, Ser 1.11 0.61 - 1.24 mg/dL   Calcium 9.3 8.9 - 10.3 mg/dL   Total Protein 7.7 6.5 - 8.1 g/dL   Albumin 4.5 3.5 - 5.0 g/dL   AST 32 15 - 41 U/L   ALT 36 0 - 44 U/L   Alkaline Phosphatase 51 38 - 126 U/L   Total Bilirubin 1.0 0.3 - 1.2 mg/dL   GFR, Estimated >60 >60 mL/min   Anion gap 8 5 - 15  Troponin I (High Sensitivity)  Result Value Ref Range   Troponin I (High Sensitivity) <2 <18 ng/L   CT Angio Chest/Abd/Pel for Dissection W and/or Wo Contrast  Result Date: 10/25/2022 CLINICAL DATA:  Acute onset central chest pain. Acute aortic syndrome suspected. EXAM: CT ANGIOGRAPHY CHEST, ABDOMEN AND PELVIS TECHNIQUE: Non-contrast CT of the chest was initially obtained. Multidetector CT imaging through the chest, abdomen and pelvis was performed using the standard protocol during bolus administration of intravenous contrast. Multiplanar reconstructed images and MIPs were obtained and reviewed to evaluate the vascular anatomy. RADIATION DOSE REDUCTION: This exam was performed according to the departmental dose-optimization program which includes automated exposure control, adjustment of the mA and/or kV according to patient size and/or use of iterative reconstruction technique. CONTRAST:  161m OMNIPAQUE IOHEXOL 350 MG/ML SOLN COMPARISON:  None Available. FINDINGS: CTA CHEST FINDINGS Cardiovascular: Pre contrast imaging of the chest shows no hyperdense crescent in the wall of the thoracic aorta to suggest the presence of an acute intramural  hematoma. No thoracic aortic aneurysm. No dissection of the thoracic aorta. Thoracic aorta is largely free of atherosclerotic disease. Bovine variant arch vessel anatomy evident with widely patent arterial arch vessel  anatomy. Mediastinum/Nodes: No mediastinal lymphadenopathy. There is no hilar lymphadenopathy. The esophagus has normal imaging features. There is no axillary lymphadenopathy. Lungs/Pleura: No suspicious pulmonary nodule or mass. No focal airspace consolidation. No pulmonary edema or pleural effusion. Mild dependent atelectasis noted both lower lobes. Musculoskeletal: No worrisome lytic or sclerotic osseous abnormality. Review of the MIP images confirms the above findings. CTA ABDOMEN AND PELVIS FINDINGS VASCULAR Aorta: Normal caliber aorta without aneurysm, dissection, vasculitis or significant stenosis. Celiac: Patent without evidence of aneurysm, dissection, vasculitis or significant stenosis. Prominent median arcuate ligament with mass-effect of the origin. SMA: Patent without evidence of aneurysm, dissection, vasculitis or significant stenosis. Renals: Both renal arteries are patent without evidence of aneurysm, dissection, vasculitis, fibromuscular dysplasia or significant stenosis. IMA: Patent without evidence of aneurysm, dissection, vasculitis or significant stenosis. Inflow: Patent without evidence of aneurysm, dissection, vasculitis or significant stenosis. Veins: No obvious venous abnormality within the limitations of this arterial phase study. Review of the MIP images confirms the above findings. NON-VASCULAR Hepatobiliary: No suspicious focal abnormality within the liver parenchyma. Gallbladder is distended with diffuse gallbladder wall thickening and trace pericholecystic fluid. No radiopaque gallstones evident by CT. No intrahepatic or extrahepatic biliary dilation. Pancreas: No focal mass lesion. No dilatation of the main duct. No intraparenchymal cyst. No peripancreatic edema. Spleen:  No splenomegaly. No focal mass lesion. Adrenals/Urinary Tract: No adrenal nodule or mass. Early caliceal contrast excretion noted in both kidneys which limits assessment for tiny stones. No suspicious renal mass lesion. No evidence for hydroureter. The urinary bladder appears normal for the degree of distention. Stomach/Bowel: Stomach is unremarkable. No gastric wall thickening. No evidence of outlet obstruction. Small focus of high density in the posterior gastric lumen is present on precontrast imaging compatible with ingested contents. Duodenum is normally positioned as is the ligament of Treitz. No small bowel wall thickening. No small bowel dilatation. The terminal ileum is normal. The appendix is not well visualized, but there is no edema or inflammation in the region of the cecum. No gross colonic mass. No colonic wall thickening. Lymphatic: There is no gastrohepatic or hepatoduodenal ligament lymphadenopathy. No retroperitoneal or mesenteric lymphadenopathy. No pelvic sidewall lymphadenopathy. Reproductive: The prostate gland and seminal vesicles are unremarkable. Other: No intraperitoneal free fluid. Musculoskeletal: No worrisome lytic or sclerotic osseous abnormality. Review of the MIP images confirms the above findings. IMPRESSION: 1. No evidence for acute intramural hematoma or dissection in the thoracoabdominal aorta. No thoracoabdominal aortic aneurysm. 2. Gallbladder is distended with diffuse gallbladder wall thickening and trace pericholecystic fluid. No radiopaque gallstones evident by CT. Imaging features raise concern for acute cholecystitis. Right upper quadrant ultrasound may prove helpful to further evaluate. Electronically Signed   By: Misty Stanley M.D.   On: 10/25/2022 06:26     EKG EKG Interpretation  Date/Time:  Sunday October 25 2022 05:27:38 EST Ventricular Rate:  67 PR Interval:  121 QRS Duration: 97 QT Interval:  373 QTC Calculation: 394 R Axis:   48 Text  Interpretation: Sinus rhythm LVH with ST T changes Baseline wander in lead(s) II aVR aVF V1 V3 V5 Confirmed by Randal Buba, Cherrise Occhipinti ([id number]) on 10/25/2022 5:51:58 AM  Radiology No results found.  Procedures Procedures    Medications Ordered in ED Medications  fentaNYL (SUBLIMAZE) injection 50 mcg (0 mcg Intravenous Hold 10/25/22 0639)  dicyclomine (BENTYL) injection 20 mg (0 mg Intramuscular Hold 10/25/22 0642)  sodium chloride 0.9 % bolus 500 mL (has no administration in time range)  0.9 %  sodium chloride infusion (has  no administration in time range)  alum & mag hydroxide-simeth (MAALOX/MYLANTA) 200-200-20 MG/5ML suspension 30 mL (30 mLs Oral Given 10/25/22 0532)  famotidine (PEPCID) IVPB 20 mg premix (0 mg Intravenous Stopped 10/25/22 0559)  ondansetron (ZOFRAN) injection 4 mg (4 mg Intravenous Given 10/25/22 0535)  iohexol (OMNIPAQUE) 350 MG/ML injection 100 mL (100 mLs Intravenous Contrast Given 10/25/22 [id number])     ED Course/ Medical Decision Making/ A&P                             Medical Decision Making Patient presents with 2 hours of subxyphoid pain and nausea and vomiting.  Last meal was a 4 pm and was fried clams and catfish and sweet tea.    Amount and/or Complexity of Data Reviewed Independent Historian: spouse External Data Reviewed: labs and notes.    Details: Previous notes and labs in epic reviewed  Labs: ordered.    Details: All labs reviewed: first troponin < 2. Normal white count 9.2, normal hemoglobin 16.4, normal platelets.  Normal sodium 135, normal creatinine 1.11, normal LFTs  Radiology: ordered and independent interpretation performed.    Details: Negative CTA for dissection by me  ECG/medicine tests: ordered and independent interpretation performed. Decision-making details documented in ED Course. Discussion of management or test interpretation with external provider(s): 637 case d/w Dr. Harrell Gave, EKG had ST changes previously.  No acute changes.    Risk OTC  drugs. Prescription drug management. Parenteral controlled substances. Risk Details: First troponin was negative.  My differential was GERD vs.  Biliary colic.  Symptoms are most consistent with those 2 diagnoses.  Patient will need a second troponin to rule out for Mi given time course.  Questioned cholecystitis on CTA, I have ordered US of the Gallbladder.  I have order fentanyl and Bentyl for the patient.  He is comfortable at this time.  The medication was placed on hold by nurse.  Patient may have these medications if needed.      Final Clinical Impression(s) / ED Diagnoses Final diagnoses:  None   Signed out to Dr. Johnney Killian pending delta troponin and [place] Rx / DC Orders ED Discharge Orders     None         Kona Lover, MD 10/25/22 [phone]

## 2022-10-25 NOTE — ED Provider Notes (Signed)
  Physical Exam  BP 123/76   Pulse 84   Temp 97.7 F (36.5 C) (Oral)   Resp (!) 23   Ht '6\' 1"'$  (1.854 m)   Wt 83.9 kg   SpO2 99%   BMI 24.41 kg/m   Physical Exam  Procedures  Procedures  ED Course / MDM    Medical Decision Making Amount and/or Complexity of Data Reviewed Labs: ordered. Radiology: ordered.  Risk OTC drugs. Prescription drug management.  Upon recheck patient is pain-free.  Not needing additional pain medications  Ultrasound result shows some wall thickening and small amount of pericholecystic fluid  09: 20 consult: Dr. Johney Maine.  If patient is pain-free and tolerating oral intake can follow-up with PCP and be seen on outpatient basis.  Patient has tolerated oral intake.  He has not had any recrudescence of pain.  Signs are stable.  I gave the patient 1 IV dose of Rocephin.  Dr. Johney Maine suggests without fever or white count, outpatient antibiotics not indicated but patient should have close follow-up and monitoring for any signs of progression of cholecystitis.  Patient is well-appearing and I reviewed the results and the plan with the patient and his wife at bedside.  Follow-up plan and return precautions reviewed.       Charlesetta Shanks, MD 10/25/22 1043

## 2022-10-25 NOTE — ED Triage Notes (Signed)
Pt with acute onset central CP. Reports he ate seafood yesterday and is now having belching, burping, pain, and radiation to the back. Pt appears in distress and is diaphoretic.

## 2022-10-26 ENCOUNTER — Telehealth: Payer: Self-pay

## 2022-10-26 NOTE — Transitions of Care (Post Inpatient/ED Visit) (Unsigned)
   10/26/2022  Name: Hunter Foster MRN: [id number] DOB: [birth year]  Today's TOC FU Call Status: Today's TOC FU Call Status:: Unsuccessul Call (1st Attempt) Unsuccessful Call (1st Attempt) Date: 10/26/22  Attempted to reach the patient regarding the most recent Inpatient/ED visit.  Follow Up Plan: Additional outreach attempts will be made to reach the patient to complete the Transitions of Care (Post Inpatient/ED visit) call.   Signature Juanda Crumble, [place] Direct Dial [phone]

## 2022-10-27 NOTE — Transitions of Care (Post Inpatient/ED Visit) (Signed)
   10/27/2022  Name: Hunter Foster MRN: [id number] DOB: Sep 14, 1980  Today's TOC FU Call Status: Today's TOC FU Call Status:: Unsuccessful Call (2nd Attempt) Unsuccessful Call (1st Attempt) Date: 10/26/22 Unsuccessful Call (2nd Attempt) Date: 10/27/22  Attempted to reach the patient regarding the most recent Inpatient/ED visit.  Follow Up Plan: No further outreach attempts will be made at this time. We have been unable to contact the patient.  Signature Juanda Crumble, [place] Direct Dial [phone]

## 2022-10-31 ENCOUNTER — Encounter: Payer: Self-pay | Admitting: Family Medicine

## 2022-11-06 ENCOUNTER — Ambulatory Visit (INDEPENDENT_AMBULATORY_CARE_PROVIDER_SITE_OTHER): Payer: 59 | Admitting: Family Medicine

## 2022-11-06 ENCOUNTER — Encounter: Payer: Self-pay | Admitting: Family Medicine

## 2022-11-06 VITALS — BP 131/81 | HR 86 | Ht 73.0 in | Wt 190.0 lb

## 2022-11-06 DIAGNOSIS — K819 Cholecystitis, unspecified: Secondary | ICD-10-CM | POA: Diagnosis not present

## 2022-11-06 NOTE — Progress Notes (Signed)
BP 131/81   Pulse 86   Ht 6\' 1"  (1.854 m)   Wt 190 lb (86.2 kg)   SpO2 100%   BMI 25.07 kg/m    Subjective:   Patient ID: Hunter Foster, male    DOB: 1981/02/15, 42 y.o.   MRN: [id number]  HPI: Hunter Foster is a 42 y.o. male presenting on 11/06/2022 for Abdominal Pain (RUQ-radiating to back- recent u/s)   HPI Gallbladder inflammation Patient had been having issues over the past 3 years every month or 2 he will get an episode where he did have chest tightness pressure especially in the epigastric region and lower chest and then it would go up to his right shoulder and neck and this had been going on for a few years off-and-on.  He had a full cardiac workup and they did not necessarily find anything until he went into the urgent care the other day and they did a gallbladder ultrasound and found that he had cholecystitis.  He says he is feeling okay today but it does flareup and get bad when he does have these episodes.  Relevant past medical, surgical, family and social history reviewed and updated as indicated. Interim medical history since our last visit reviewed. Allergies and medications reviewed and updated.  Review of Systems  Constitutional:  Negative for chills and fever.  Respiratory:  Positive for chest tightness. Negative for shortness of breath and wheezing.   Cardiovascular:  Positive for chest pain. Negative for leg swelling.  Gastrointestinal:  Positive for abdominal pain and nausea. Negative for constipation, diarrhea and vomiting.  Musculoskeletal:  Negative for back pain and gait problem.  Skin:  Negative for rash.  All other systems reviewed and are negative.   Per HPI unless specifically indicated above   Allergies as of 11/06/2022   No Known Allergies      Medication List        Accurate as of November 06, 2022  3:25 PM. If you have any questions, ask your nurse or doctor.          HYDROcodone-acetaminophen 5-325 MG tablet Commonly known as:  NORCO/VICODIN Take 1-2 tablets by mouth every 6 (six) hours as needed.   omeprazole 20 MG capsule Commonly known as: PRILOSEC Take 1 capsule (20 mg total) by mouth daily. What changed: Another medication with the same name was removed. Continue taking this medication, and follow the directions you see here. Changed by: Fransisca Kaufmann Tanzie Rothschild, MD         Objective:   BP 131/81   Pulse 86   Ht 6\' 1"  (1.854 m)   Wt 190 lb (86.2 kg)   SpO2 100%   BMI 25.07 kg/m   Wt Readings from Last 3 Encounters:  11/06/22 190 lb (86.2 kg)  10/25/22 185 lb (83.9 kg)  08/07/21 191 lb (86.6 kg)    Physical Exam Vitals and nursing note reviewed.  Constitutional:      General: He is not in acute distress.    Appearance: He is well-developed. He is not diaphoretic.  Eyes:     General: No scleral icterus.    Conjunctiva/sclera: Conjunctivae normal.  Neck:     Thyroid: No thyromegaly.  Cardiovascular:     Rate and Rhythm: Normal rate and regular rhythm.     Heart sounds: Normal heart sounds. No murmur heard. Pulmonary:     Effort: Pulmonary effort is normal. No respiratory distress.     Breath sounds: Normal breath sounds. No  wheezing.  Abdominal:     General: Abdomen is flat. Bowel sounds are normal. There is no distension.     Tenderness: There is no abdominal tenderness. There is no guarding or rebound.  Musculoskeletal:        General: Normal range of motion.     Cervical back: Neck supple.  Lymphadenopathy:     Cervical: No cervical adenopathy.  Skin:    General: Skin is warm and dry.     Findings: No rash.  Neurological:     Mental Status: He is alert and oriented to person, place, and time.     Coordination: Coordination normal.  Psychiatric:        Behavior: Behavior normal.       Assessment & Plan:   Problem List Items Addressed This Visit   None Visit Diagnoses     Cholecystitis    -  Primary   Relevant Orders   Ambulatory referral to General Surgery        Will refer to general surgery for cholecystitis.  In the meantime recommended that he avoid fatty fried greasy foods. Follow up plan: Return if symptoms worsen or fail to improve.  Counseling provided for all of the vaccine components Orders Placed This Encounter  Procedures   Ambulatory referral to [facility] Mechel Schutter, MD [place] 11/06/2022, 3:25 PM

## 2022-12-03 DIAGNOSIS — K819 Cholecystitis, unspecified: Secondary | ICD-10-CM | POA: Diagnosis not present

## 2022-12-03 DIAGNOSIS — K828 Other specified diseases of gallbladder: Secondary | ICD-10-CM | POA: Diagnosis not present

## 2023-01-12 ENCOUNTER — Ambulatory Visit: Payer: Self-pay | Admitting: General Surgery

## 2023-01-12 DIAGNOSIS — K819 Cholecystitis, unspecified: Secondary | ICD-10-CM

## 2023-01-12 NOTE — Progress Notes (Signed)
Surgery orders requested via Epic inbox. °

## 2023-01-13 NOTE — Progress Notes (Addendum)
Anesthesia Review:  PCP: Ivin Booty Dettinger  Cardiologist : none  Chest x-ray : EKG : 10/25/22  Echo : Stress test: 20222  CT Angio chest- 10/25/22  Cardiac Cath :  Activity level: can do a flgiht of stairs without difficuty  Sleep Study/ CPAP : none  Fasting Blood Sugar :      / Checks Blood Sugar -- times a day:   Blood Thinner/ Instructions /Last Dose: ASA / Instructions/ Last Dose :

## 2023-01-19 NOTE — Patient Instructions (Signed)
SURGICAL WAITING ROOM VISITATION  Patients having surgery or a procedure may have no more than 2 support people in the waiting area - these visitors may rotate.    Children under the age of 45 must have an adult with them who is not the patient.  Due to an increase in RSV and influenza rates and associated hospitalizations, children ages 51 and under may not visit patients in [facility] hospitals.  If the patient needs to stay at the hospital during part of their recovery, the visitor guidelines for inpatient rooms apply. Pre-op nurse will coordinate an appropriate time for 1 support person to accompany patient in pre-op.  This support person may not rotate.    Please refer to the [facility] website for the visitor guidelines for Inpatients (after your surgery is over and you are in a regular room).       Your procedure is scheduled on:  01/28/2023    Report to [facility] Main Entrance    Report to admitting at  0700 AM   Call this number if you have problems the morning of surgery [phone]   Do not eat food :After Midnight.   After Midnight you may have the following liquids until __ 0600____ AM  DAY OF SURGERY  Water Non-Citrus Juices (without pulp, NO RED-Apple, White grape, White cranberry) Black Coffee (NO MILK/CREAM OR CREAMERS, sugar ok)  Clear Tea (NO MILK/CREAM OR CREAMERS, sugar ok) regular and decaf                             Plain Jell-O (NO RED)                                           Fruit ices (not with fruit pulp, NO RED)                                     Popsicles (NO RED)                                                               Sports drinks like Gatorade (NO RED)                            If you have questions, please contact your surgeon's office.  !     Oral Hygiene is also important to reduce your risk of infection.                                    Remember - BRUSH YOUR TEETH THE MORNING OF SURGERY WITH YOUR REGULAR  TOOTHPASTE  DENTURES WILL BE REMOVED PRIOR TO SURGERY PLEASE DO NOT APPLY "Poly grip" OR ADHESIVES!!!   Do NOT smoke after Midnight   Take these medicines the morning of surgery with A SIP OF WATER:  omeprazole   DO NOT TAKE ANY ORAL DIABETIC MEDICATIONS DAY OF YOUR SURGERY  Bring CPAP mask and tubing  day of surgery.                              You may not have any metal on your body including hair pins, jewelry, and body piercing             Do not wear make-up, lotions, powders, perfumes/cologne, or deodorant  Do not wear nail polish including gel and S&S, artificial/acrylic nails, or any other type of covering on natural nails including finger and toenails. If you have artificial nails, gel coating, etc. that needs to be removed by a nail salon please have this removed prior to surgery or surgery may need to be canceled/ delayed if the surgeon/ anesthesia feels like they are unable to be safely monitored.   Do not shave  48 hours prior to surgery.               Men may shave face and neck.   Do not bring valuables to the hospital. [facility] IS NOT             RESPONSIBLE   FOR VALUABLES.   Contacts, glasses, dentures or bridgework may not be worn into surgery.   Bring small overnight bag day of surgery.   DO NOT BRING YOUR HOME MEDICATIONS TO THE HOSPITAL. PHARMACY WILL DISPENSE MEDICATIONS LISTED ON YOUR MEDICATION LIST TO YOU DURING YOUR ADMISSION IN THE HOSPITAL!    Patients discharged on the day of surgery will not be allowed to drive home.  Someone NEEDS to stay with you for the first 24 hours after anesthesia.   Special Instructions: Bring a copy of your healthcare power of attorney and living will documents the day of surgery if you haven't scanned them before.              Please read over the following fact sheets you were given: IF YOU HAVE QUESTIONS ABOUT YOUR PRE-OP INSTRUCTIONS PLEASE CALL [phone]   If you received a COVID test during your pre-op visit   it is requested that you wear a mask when out in public, stay away from anyone that may not be feeling well and notify your surgeon if you develop symptoms. If you test positive for Covid or have been in contact with anyone that has tested positive in the last 10 days please notify you surgeon.    [facility] - Preparing for Surgery Before surgery, you can play an important role.  Because skin is not sterile, your skin needs to be as free of germs as possible.  You can reduce the number of germs on your skin by washing with CHG (chlorahexidine gluconate) soap before surgery.  CHG is an antiseptic cleaner which kills germs and bonds with the skin to continue killing germs even after washing. Please DO NOT use if you have an allergy to CHG or antibacterial soaps.  If your skin becomes reddened/irritated stop using the CHG and inform your nurse when you arrive at Short Stay. Do not shave (including legs and underarms) for at least 48 hours prior to the first CHG shower.  You may shave your face/neck. Please follow these instructions carefully:  1.  Shower with CHG Soap the night before surgery and the  morning of Surgery.  2.  If you choose to wash your hair, wash your hair first as usual with your  normal  shampoo.  3.  After you shampoo, rinse your hair and body  thoroughly to remove the  shampoo.                           4.  Use CHG as you would any other liquid soap.  You can apply chg directly  to the skin and wash                       Gently with a scrungie or clean washcloth.  5.  Apply the CHG Soap to your body ONLY FROM THE NECK DOWN.   Do not use on face/ open                           Wound or open sores. Avoid contact with eyes, ears mouth and genitals (private parts).                       Wash face,  Genitals (private parts) with your normal soap.             6.  Wash thoroughly, paying special attention to the area where your surgery  will be performed.  7.  Thoroughly rinse your body  with warm water from the neck down.  8.  DO NOT shower/wash with your normal soap after using and rinsing off  the CHG Soap.                9.  Pat yourself dry with a clean towel.            10.  Wear clean pajamas.            11.  Place clean sheets on your bed the night of your first shower and do not  sleep with pets. Day of Surgery : Do not apply any lotions/deodorants the morning of surgery.  Please wear clean clothes to the hospital/surgery center.  FAILURE TO FOLLOW THESE INSTRUCTIONS MAY RESULT IN THE CANCELLATION OF YOUR SURGERY PATIENT SIGNATURE_________________________________  NURSE SIGNATURE__________________________________  ________________________________________________________________________

## 2023-01-20 ENCOUNTER — Other Ambulatory Visit: Payer: Self-pay

## 2023-01-20 ENCOUNTER — Encounter (HOSPITAL_COMMUNITY): Payer: Self-pay

## 2023-01-20 ENCOUNTER — Encounter (HOSPITAL_COMMUNITY)
Admission: RE | Admit: 2023-01-20 | Discharge: 2023-01-20 | Disposition: A | Discharge status: Home / Self Care | Payer: 59 | Source: Ambulatory Visit | Attending: General Surgery | Admitting: General Surgery

## 2023-01-20 VITALS — BP 121/87 | HR 73 | Temp 98.1°F | Resp 16 | Ht 73.0 in | Wt 185.0 lb

## 2023-01-20 DIAGNOSIS — K819 Cholecystitis, unspecified: Secondary | ICD-10-CM | POA: Insufficient documentation

## 2023-01-20 DIAGNOSIS — Z01812 Encounter for preprocedural laboratory examination: Secondary | ICD-10-CM | POA: Insufficient documentation

## 2023-01-20 DIAGNOSIS — Z01818 Encounter for other preprocedural examination: Secondary | ICD-10-CM

## 2023-01-20 HISTORY — DX: Gastro-esophageal reflux disease without esophagitis: K21.9

## 2023-01-20 LAB — CBC
HCT: 46.3 % (ref 39.0–52.0)
Hemoglobin: 16.5 g/dL (ref 13.0–17.0)
MCH: 33.7 pg (ref 26.0–34.0)
MCHC: 35.6 g/dL (ref 30.0–36.0)
MCV: 94.5 fL (ref 80.0–100.0)
Platelets: 266 10*3/uL (ref 150–400)
RBC: 4.9 MIL/uL (ref 4.22–5.81)
RDW: 11.9 % (ref 11.5–15.5)
WBC: 7.9 10*3/uL (ref 4.0–10.5)
nRBC: 0 % (ref 0.0–0.2)

## 2023-01-20 LAB — COMPREHENSIVE METABOLIC PANEL
ALT: 33 U/L (ref 0–44)
AST: 26 U/L (ref 15–41)
Albumin: 4.7 g/dL (ref 3.5–5.0)
Alkaline Phosphatase: 43 U/L (ref 38–126)
Anion gap: 10 (ref 5–15)
BUN: 15 mg/dL (ref 6–20)
CO2: 24 mmol/L (ref 22–32)
Calcium: 9 mg/dL (ref 8.9–10.3)
Chloride: 103 mmol/L (ref 98–111)
Creatinine, Ser: 1.09 mg/dL (ref 0.61–1.24)
GFR, Estimated: >60 mL/min (ref >60)
Glucose, Bld: 88 mg/dL (ref 70–99)
Potassium: 4.2 mmol/L (ref 3.5–5.1)
Sodium: 137 mmol/L (ref 135–145)
Total Bilirubin: 1.1 mg/dL (ref 0.3–1.2)
Total Protein: 7.7 g/dL (ref 6.5–8.1)

## 2023-01-28 ENCOUNTER — Ambulatory Visit (HOSPITAL_COMMUNITY): Payer: 59 | Admitting: Physician Assistant

## 2023-01-28 ENCOUNTER — Ambulatory Visit (HOSPITAL_COMMUNITY)
Admission: RE | Admit: 2023-01-28 | Discharge: 2023-01-28 | Disposition: A | Discharge status: Home / Self Care | Payer: 59 | Source: Ambulatory Visit | Attending: General Surgery | Admitting: General Surgery

## 2023-01-28 ENCOUNTER — Ambulatory Visit (HOSPITAL_BASED_OUTPATIENT_CLINIC_OR_DEPARTMENT_OTHER): Payer: 59 | Admitting: Anesthesiology

## 2023-01-28 ENCOUNTER — Encounter (HOSPITAL_COMMUNITY)
Admission: RE | Disposition: A | Discharge status: Home / Self Care | Payer: Self-pay | Source: Ambulatory Visit | Attending: General Surgery

## 2023-01-28 ENCOUNTER — Encounter (HOSPITAL_COMMUNITY): Payer: Self-pay | Admitting: General Surgery

## 2023-01-28 ENCOUNTER — Other Ambulatory Visit: Payer: Self-pay

## 2023-01-28 DIAGNOSIS — K811 Chronic cholecystitis: Secondary | ICD-10-CM | POA: Insufficient documentation

## 2023-01-28 DIAGNOSIS — K219 Gastro-esophageal reflux disease without esophagitis: Secondary | ICD-10-CM | POA: Insufficient documentation

## 2023-01-28 DIAGNOSIS — K828 Other specified diseases of gallbladder: Secondary | ICD-10-CM | POA: Diagnosis not present

## 2023-01-28 DIAGNOSIS — Z01818 Encounter for other preprocedural examination: Secondary | ICD-10-CM

## 2023-01-28 DIAGNOSIS — J45909 Unspecified asthma, uncomplicated: Secondary | ICD-10-CM

## 2023-01-28 HISTORY — PX: CHOLECYSTECTOMY: SHX55

## 2023-01-28 SURGERY — LAPAROSCOPIC CHOLECYSTECTOMY WITH INTRAOPERATIVE CHOLANGIOGRAM
Anesthesia: General

## 2023-01-28 MED ORDER — ROCURONIUM BROMIDE 10 MG/ML (PF) SYRINGE
PREFILLED_SYRINGE | INTRAVENOUS | Status: AC
  Filled 2023-01-28: qty 10

## 2023-01-28 MED ORDER — ACETAMINOPHEN 325 MG PO TABS: 325.0000 mg | ORAL_TABLET | ORAL | Status: DC | PRN

## 2023-01-28 MED ORDER — BUPIVACAINE-EPINEPHRINE 0.25% -1:200000 IJ SOLN
INTRAMUSCULAR | Status: AC
  Filled 2023-01-28: qty 1

## 2023-01-28 MED ORDER — SPY AGENT GREEN - (INDOCYANINE FOR INJECTION)
1.2500 mg | Freq: Once | INTRAMUSCULAR | Status: AC
  Administered 2023-01-28: 1.25 mg via INTRAVENOUS
  Filled 2023-01-28: qty 10

## 2023-01-28 MED ORDER — PROPOFOL 10 MG/ML IV BOLUS
INTRAVENOUS | Status: DC | PRN
  Administered 2023-01-28: 160 mg via INTRAVENOUS

## 2023-01-28 MED ORDER — BUPIVACAINE-EPINEPHRINE 0.25% -1:200000 IJ SOLN
INTRAMUSCULAR | Status: DC | PRN
  Administered 2023-01-28: 40 mL

## 2023-01-28 MED ORDER — DEXAMETHASONE SODIUM PHOSPHATE 10 MG/ML IJ SOLN
INTRAMUSCULAR | Status: DC | PRN
  Administered 2023-01-28: 10 mg via INTRAVENOUS

## 2023-01-28 MED ORDER — ACETAMINOPHEN 500 MG PO TABS: 1000.0000 mg | ORAL_TABLET | Freq: Once | ORAL | Status: DC

## 2023-01-28 MED ORDER — OXYCODONE HCL 5 MG/5ML PO SOLN: 5.0000 mg | Freq: Once | ORAL | Status: DC | PRN

## 2023-01-28 MED ORDER — ONDANSETRON HCL 4 MG/2ML IJ SOLN
INTRAMUSCULAR | Status: AC
  Filled 2023-01-28: qty 2

## 2023-01-28 MED ORDER — KETOROLAC TROMETHAMINE 30 MG/ML IJ SOLN
INTRAMUSCULAR | Status: DC | PRN
  Administered 2023-01-28: 30 mg via INTRAVENOUS

## 2023-01-28 MED ORDER — ACETAMINOPHEN 160 MG/5ML PO SOLN: 325.0000 mg | ORAL | Status: DC | PRN

## 2023-01-28 MED ORDER — ACETAMINOPHEN 10 MG/ML IV SOLN: 1000.0000 mg | Freq: Once | INTRAVENOUS | Status: DC | PRN

## 2023-01-28 MED ORDER — LACTATED RINGERS IR SOLN
Status: DC | PRN
  Administered 2023-01-28: 1000 mL

## 2023-01-28 MED ORDER — FENTANYL CITRATE PF 50 MCG/ML IJ SOSY: 25.0000 ug | PREFILLED_SYRINGE | INTRAMUSCULAR | Status: DC | PRN

## 2023-01-28 MED ORDER — CHLORHEXIDINE GLUCONATE CLOTH 2 % EX PADS: 6.0000 | MEDICATED_PAD | Freq: Once | CUTANEOUS | Status: DC

## 2023-01-28 MED ORDER — ONDANSETRON HCL 4 MG/2ML IJ SOLN
INTRAMUSCULAR | Status: DC | PRN
  Administered 2023-01-28: 4 mg via INTRAVENOUS

## 2023-01-28 MED ORDER — PROPOFOL 10 MG/ML IV BOLUS
INTRAVENOUS | Status: AC
  Filled 2023-01-28: qty 20

## 2023-01-28 MED ORDER — ROCURONIUM BROMIDE 10 MG/ML (PF) SYRINGE
PREFILLED_SYRINGE | INTRAVENOUS | Status: DC | PRN
  Administered 2023-01-28: 60 mg via INTRAVENOUS

## 2023-01-28 MED ORDER — ACETAMINOPHEN 500 MG PO TABS
1000.0000 mg | ORAL_TABLET | ORAL | Status: AC
  Administered 2023-01-28: 1000 mg via ORAL
  Filled 2023-01-28: qty 2

## 2023-01-28 MED ORDER — ORAL CARE MOUTH RINSE: 15.0000 mL | Freq: Once | OROMUCOSAL | Status: AC

## 2023-01-28 MED ORDER — FENTANYL CITRATE (PF) 250 MCG/5ML IJ SOLN
INTRAMUSCULAR | Status: AC
  Filled 2023-01-28: qty 5

## 2023-01-28 MED ORDER — LIDOCAINE HCL 2 % IJ SOLN
INTRAMUSCULAR | Status: AC
  Filled 2023-01-28: qty 20

## 2023-01-28 MED ORDER — OXYCODONE HCL 5 MG PO TABS: 5.0000 mg | ORAL_TABLET | Freq: Once | ORAL | Status: DC | PRN

## 2023-01-28 MED ORDER — PROMETHAZINE HCL 25 MG/ML IJ SOLN: 6.2500 mg | INTRAMUSCULAR | Status: DC | PRN

## 2023-01-28 MED ORDER — KETAMINE HCL 10 MG/ML IJ SOLN
INTRAMUSCULAR | Status: DC | PRN
  Administered 2023-01-28: 40 mg via INTRAVENOUS

## 2023-01-28 MED ORDER — FENTANYL CITRATE (PF) 250 MCG/5ML IJ SOLN
INTRAMUSCULAR | Status: DC | PRN
  Administered 2023-01-28: 50 ug via INTRAVENOUS
  Administered 2023-01-28: 100 ug via INTRAVENOUS
  Administered 2023-01-28 (×2): 50 ug via INTRAVENOUS

## 2023-01-28 MED ORDER — CHLORHEXIDINE GLUCONATE 0.12 % MT SOLN
15.0000 mL | Freq: Once | OROMUCOSAL | Status: AC
  Administered 2023-01-28: 15 mL via OROMUCOSAL

## 2023-01-28 MED ORDER — SODIUM CHLORIDE 0.9 % IV SOLN
2.0000 g | INTRAVENOUS | Status: AC
  Administered 2023-01-28: 2 g via INTRAVENOUS
  Filled 2023-01-28: qty 2

## 2023-01-28 MED ORDER — 0.9 % SODIUM CHLORIDE (POUR BTL) OPTIME
TOPICAL | Status: DC | PRN
  Administered 2023-01-28: 1000 mL

## 2023-01-28 MED ORDER — KETAMINE HCL 50 MG/5ML IJ SOSY
PREFILLED_SYRINGE | INTRAMUSCULAR | Status: AC
  Filled 2023-01-28: qty 5

## 2023-01-28 MED ORDER — AMISULPRIDE (ANTIEMETIC) 5 MG/2ML IV SOLN: 10.0000 mg | Freq: Once | INTRAVENOUS | Status: DC | PRN

## 2023-01-28 MED ORDER — LIDOCAINE 2% (20 MG/ML) 5 ML SYRINGE
INTRAMUSCULAR | Status: DC | PRN
  Administered 2023-01-28: 60 mg via INTRAVENOUS

## 2023-01-28 MED ORDER — LACTATED RINGERS IV SOLN: INTRAVENOUS | Status: DC

## 2023-01-28 MED ORDER — MIDAZOLAM HCL 2 MG/2ML IJ SOLN
INTRAMUSCULAR | Status: AC
  Filled 2023-01-28: qty 2

## 2023-01-28 MED ORDER — SUGAMMADEX SODIUM 200 MG/2ML IV SOLN
INTRAVENOUS | Status: DC | PRN
  Administered 2023-01-28: 160 mg via INTRAVENOUS

## 2023-01-28 MED ORDER — LIDOCAINE HCL (PF) 2 % IJ SOLN
INTRAMUSCULAR | Status: AC
  Filled 2023-01-28: qty 5

## 2023-01-28 MED ORDER — LIDOCAINE HCL (PF) 2 % IJ SOLN
INTRAMUSCULAR | Status: DC | PRN
  Administered 2023-01-28: 1.5 mg/kg/h via INTRADERMAL

## 2023-01-28 MED ORDER — DEXAMETHASONE SODIUM PHOSPHATE 10 MG/ML IJ SOLN
INTRAMUSCULAR | Status: AC
  Filled 2023-01-28: qty 1

## 2023-01-28 MED ORDER — MIDAZOLAM HCL 5 MG/5ML IJ SOLN
INTRAMUSCULAR | Status: DC | PRN
  Administered 2023-01-28: 2 mg via INTRAVENOUS

## 2023-01-28 MED ORDER — KETOROLAC TROMETHAMINE 30 MG/ML IJ SOLN
INTRAMUSCULAR | Status: AC
  Filled 2023-01-28: qty 1

## 2023-01-28 MED ORDER — OXYCODONE HCL 5 MG PO TABS: 5.0000 mg | ORAL_TABLET | Freq: Four times a day (QID) | ORAL | 0 refills | Status: DC | PRN

## 2023-01-28 MED ORDER — ACETAMINOPHEN 500 MG PO TABS: 1000.0000 mg | ORAL_TABLET | Freq: Three times a day (TID) | ORAL | 0 refills | Status: AC

## 2023-01-28 SURGICAL SUPPLY — 55 items
ADH SKN CLS APL DERMABOND .7 (GAUZE/BANDAGES/DRESSINGS) ×1
APL PRP STRL LF DISP 70% ISPRP (MISCELLANEOUS) ×1
APL SRG 38 LTWT LNG FL B (MISCELLANEOUS)
APPLICATOR ARISTA FLEXITIP XL (MISCELLANEOUS) IMPLANT
APPLIER CLIP 5 13 M/L LIGAMAX5 (MISCELLANEOUS) ×1
APPLIER CLIP ROT 10 11.4 M/L (STAPLE)
APR CLP MED LRG 11.4X10 (STAPLE)
APR CLP MED LRG 5 ANG JAW (MISCELLANEOUS) ×1
BAG COUNTER SPONGE SURGICOUNT (BAG) IMPLANT
BAG SPEC RTRVL 10 TROC 200 (ENDOMECHANICALS) ×1
BAG SPNG CNTER NS LX DISP (BAG)
CABLE HIGH FREQUENCY MONO STRZ (ELECTRODE) ×1 IMPLANT
CHLORAPREP W/TINT 26 (MISCELLANEOUS) ×1 IMPLANT
CLIP APPLIE 5 13 M/L LIGAMAX5 (MISCELLANEOUS) IMPLANT
CLIP APPLIE ROT 10 11.4 M/L (STAPLE) IMPLANT
CLIP LIGATING HEMO O LOK GREEN (MISCELLANEOUS) IMPLANT
COVER MAYO STAND XLG (MISCELLANEOUS) IMPLANT
COVER SURGICAL LIGHT HANDLE (MISCELLANEOUS) ×1 IMPLANT
DERMABOND ADVANCED .7 DNX12 (GAUZE/BANDAGES/DRESSINGS) IMPLANT
DRAPE C-ARM 42X120 X-RAY (DRAPES) IMPLANT
DRSG TEGADERM 2-3/8X2-3/4 SM (GAUZE/BANDAGES/DRESSINGS) ×3 IMPLANT
DRSG TEGADERM 4X4.75 (GAUZE/BANDAGES/DRESSINGS) ×1 IMPLANT
ELECT REM PT RETURN 15FT ADLT (MISCELLANEOUS) ×1 IMPLANT
GAUZE SPONGE 2X2 8PLY STRL LF (GAUZE/BANDAGES/DRESSINGS) ×1 IMPLANT
GLOVE BIO SURGEON STRL SZ7.5 (GLOVE) ×1 IMPLANT
GLOVE INDICATOR 8.0 STRL GRN (GLOVE) ×1 IMPLANT
GOWN STRL REUS W/ TWL XL LVL3 (GOWN DISPOSABLE) ×1 IMPLANT
GOWN STRL REUS W/TWL XL LVL3 (GOWN DISPOSABLE) ×1
GRASPER SUT TROCAR 14GX15 (MISCELLANEOUS) IMPLANT
HEMOSTAT ARISTA ABSORB 3G PWDR (HEMOSTASIS) IMPLANT
HEMOSTAT SNOW SURGICEL 2X4 (HEMOSTASIS) IMPLANT
IRRIG SUCT STRYKERFLOW 2 WTIP (MISCELLANEOUS) ×1
IRRIGATION SUCT STRKRFLW 2 WTP (MISCELLANEOUS) ×1 IMPLANT
KIT BASIN OR (CUSTOM PROCEDURE TRAY) ×1 IMPLANT
KIT TURNOVER KIT A (KITS) IMPLANT
L-HOOK LAP DISP 36CM (ELECTROSURGICAL)
LHOOK LAP DISP 36CM (ELECTROSURGICAL) IMPLANT
POUCH RETRIEVAL ECOSAC 10 (ENDOMECHANICALS) ×1 IMPLANT
SCISSORS LAP 5X35 DISP (ENDOMECHANICALS) ×1 IMPLANT
SET CHOLANGIOGRAPH MIX (MISCELLANEOUS) IMPLANT
SET TUBE SMOKE EVAC HIGH FLOW (TUBING) ×1 IMPLANT
SLEEVE ADV FIXATION 5X100MM (TROCAR) ×1 IMPLANT
SPIKE FLUID TRANSFER (MISCELLANEOUS) ×1 IMPLANT
STRIP CLOSURE SKIN 1/2X4 (GAUZE/BANDAGES/DRESSINGS) ×1 IMPLANT
SUT MNCRL AB 4-0 PS2 18 (SUTURE) ×1 IMPLANT
SUT VIC AB 0 UR5 27 (SUTURE) IMPLANT
SUT VICRYL 0 TIES 12 18 (SUTURE) IMPLANT
SUT VICRYL 0 UR6 27IN ABS (SUTURE) IMPLANT
TOWEL OR 17X26 10 PK STRL BLUE (TOWEL DISPOSABLE) ×1 IMPLANT
TOWEL OR NON WOVEN STRL DISP B (DISPOSABLE) ×1 IMPLANT
TRAY LAPAROSCOPIC (CUSTOM PROCEDURE TRAY) ×1 IMPLANT
TROCAR ADV FIXATION 12X100MM (TROCAR) IMPLANT
TROCAR ADV FIXATION 5X100MM (TROCAR) ×1 IMPLANT
TROCAR BALLN 12MMX100 BLUNT (TROCAR) IMPLANT
TROCAR XCEL NON-BLD 5MMX100MML (ENDOMECHANICALS) IMPLANT

## 2023-01-28 NOTE — Anesthesia Preprocedure Evaluation (Addendum)
Anesthesia Evaluation  Patient identified by MRN, date of birth, ID band Patient awake    Reviewed: Allergy & Precautions, NPO status , Patient's Chart, lab work & pertinent test results  Airway Mallampati: II  TM Distance: >3 FB Neck ROM: Full    Dental  (+) Teeth Intact, Dental Advisory Given, Chipped,    Pulmonary asthma    Pulmonary exam normal        Cardiovascular negative cardio ROS Normal cardiovascular exam     Neuro/Psych negative neurological ROS  negative psych ROS   GI/Hepatic Neg liver ROS,GERD  Medicated,,  Endo/Other  negative endocrine ROS    Renal/GU negative Renal ROS     Musculoskeletal negative musculoskeletal ROS (+)    Abdominal   Peds  Hematology negative hematology ROS (+)   Anesthesia Other Findings   Reproductive/Obstetrics                             Anesthesia Physical Anesthesia Plan  ASA: 2  Anesthesia Plan: General   Post-op Pain Management: Tylenol PO (pre-op)* and Toradol IV (intra-op)*   Induction: Intravenous  PONV Risk Score and Plan: 3 and Ondansetron, Dexamethasone and Midazolam  Airway Management Planned: Oral ETT  Additional Equipment: None  Intra-op Plan:   Post-operative Plan: Extubation in OR  Informed Consent: I have reviewed the patients History and Physical, chart, labs and discussed the procedure including the risks, benefits and alternatives for the proposed anesthesia with the patient or authorized representative who has indicated his/her understanding and acceptance.     Dental advisory given  Plan Discussed with: CRNA  Anesthesia Plan Comments:        Anesthesia Quick Evaluation

## 2023-01-28 NOTE — Discharge Instructions (Signed)

## 2023-01-28 NOTE — H&P (Signed)
CC: here for surgery  Requesting provider: n/a  HPI: Hunter Foster is an 42 y.o. male who is here for lap chole with icg dye. Denies changes since seen in clinic.   He reports a several year history of sore central chest pain that will radiate to his back and right shoulder and sometimes up his neck and down his arm. It is fairly intense. It last for about an hour at a time. No particular pattern to food intake. The episodes would generally occur infrequently but have increased in frequency recently. When they for started happening he ended up seeing cardiology and had a stress test which was negative. Most recently he ended up in the emergency room last month when he had another attack that was quite severe. This 1 was associate with nausea and vomiting. He had a CTA of the chest abdomen pelvis as well as an ultrasound. He was found to have a calculus cholecystitis. When he has had the prior episodes he has tried Tums with a little bit improvement. The pain does not correlate with physical activity or exertion. No unplanned weight loss. No melena or hematochezia. No bloating.  He works with [employer]ilities [employer]   Past Medical History:  Diagnosis Date   Asthma    as a child   GERD (gastroesophageal reflux disease)     Past Surgical History:  Procedure Laterality Date   WISDOM TOOTH EXTRACTION      Family History  Problem Relation Age of Onset   Heart attack Father    Diabetes Father    Alzheimer's disease Paternal Grandmother     Social:  reports that he has never smoked. He has never used smokeless tobacco. He reports current alcohol use. He reports that he does not use drugs.  Allergies: Not on File  Medications: I have reviewed the patient's current medications.   ROS - all of the below systems have been reviewed with the patient and positives are indicated with bold text General: chills, fever or night sweats Eyes: blurry vision or double vision ENT: epistaxis or sore  throat Allergy/Immunology: itchy/watery eyes or nasal congestion Hematologic/Lymphatic: bleeding problems, blood clots or swollen lymph nodes Endocrine: temperature intolerance or unexpected weight changes Breast: new or changing breast lumps or nipple discharge Resp: cough, shortness of breath, or wheezing CV: chest pain or dyspnea on exertion GI: as per HPI GU: dysuria, trouble voiding, or hematuria MSK: joint pain or joint stiffness Neuro: TIA or stroke symptoms Derm: pruritus and skin lesion changes Psych: anxiety and depression  PE Blood pressure (!) 130/91, pulse 97, temperature 98.1 F (36.7 C), temperature source Oral, resp. rate 16, height 6\' 1"  (1.854 m), weight 83 kg, SpO2 98 %. Constitutional: NAD; conversant; no deformities Eyes: Moist conjunctiva; no lid lag; anicteric; PERRL Neck: Trachea midline; no thyromegaly Lungs: Normal respiratory effort; no tactile fremitus CV: RRR; no palpable thrills; no pitting edema GI: Abd soft, nt; no palpable hepatosplenomegaly MSK: Normal gait; no clubbing/cyanosis Psychiatric: Appropriate affect; alert and oriented x3 Lymphatic: No palpable cervical or axillary lymphadenopathy Skin:no rash/lesions/jaundice  No results found for this or any previous visit (from the past 48 hour(s)).  No results found.  Imaging: reviewed  A/P: Hunter Foster is an 42 y.o. male with  Biliary dyskinesia  H/o Acalculous cholecystitis   IV abx All questions asked and answered ERAS Discussed [facility] resident involvement in case.   Mary Sella. Andrey Campanile, MD, FACS General, Bariatric, & Minimally Invasive Surgery [facility]

## 2023-01-28 NOTE — Op Note (Signed)
Hunter Foster [id number] 1980-10-13 01/28/2023  Laparoscopic Cholecystectomy with near infrared fluorescent cholangiography procedure Note  Indications: This patient presents with symptomatic gallbladder disease and will undergo laparoscopic cholecystectomy.  Pre-operative Diagnosis: Biliary dyskinesia, history of acalculus cholecystitis  Post-operative Diagnosis: Same  Surgeon: Gaynelle Adu MD FACS  Assistants: Margarito Courser PGY-3  Anesthesia: General endotracheal anesthesia  Procedure Details  The patient was seen again in the Holding Room. The risks, benefits, complications, treatment options, and expected outcomes were discussed with the patient. The possibilities of reaction to medication, pulmonary aspiration, perforation of viscus, bleeding, recurrent infection, finding a normal gallbladder, the need for additional procedures, failure to diagnose a condition, the possible need to convert to an open procedure, and creating a complication requiring transfusion or operation were discussed with the patient. The likelihood of improving the patient's symptoms with return to their baseline status is good.  The patient and/or family concurred with the proposed plan, giving informed consent. The site of surgery properly noted. The patient was taken to Operating Room, identified as Hunter Foster and the procedure verified as Laparoscopic Cholecystectomy with ICG dye.  A Time Out was held and the above information confirmed. Antibiotic prophylaxis was administered.    ICG dye was administered preoperatively.    General endotracheal anesthesia was then administered and tolerated well. After the induction, the abdomen was prepped with Chloraprep and draped in the sterile fashion. The patient was positioned in the supine position.  Local anesthetic agent was injected into the skin near the umbilicus and an incision made. We dissected down to the abdominal fascia with blunt dissection.  The fascia  was incised vertically and we entered the peritoneal cavity bluntly.  A pursestring suture of 0-Vicryl was placed around the fascial opening.  The Hasson cannula was inserted and secured with the stay suture.  Pneumoperitoneum was then created with CO2 and tolerated well without any adverse changes in the patient's vital signs. An 5-mm port was placed in the subxiphoid position.  Two 5-mm ports were placed in the right upper quadrant. All skin incisions were infiltrated with a local anesthetic agent before making the incision and placing the trocars.   We positioned the patient in reverse Trendelenburg, tilted slightly to the patient's left.  The gallbladder was identified, the fundus grasped and retracted cephalad. Adhesions were lysed bluntly and with the electrocautery where indicated, taking care not to injure any adjacent organs or viscus. The infundibulum was grasped and retracted laterally, exposing the peritoneum overlying the triangle of Calot. This was then divided and exposed in a blunt fashion. A critical view of the cystic duct and cystic artery was obtained.   The cystic duct was clearly identified and bluntly dissected circumferentially.  Utilizing the Stryker camera system near infrared fluorescent activity was visualized in the liver, cystic duct. There was too much friable adipose over the common hepatic and bile ducts to see the near infrared fluorescent .  This served as a secondary confirmation of our anatomy.  The cystic duct was then ligated with 3 clips proximally and divided. The cystic artery which had been identified & dissected free was ligated with clips and divided as well.   The gallbladder was dissected from the liver bed in retrograde fashion with the electrocautery. The gallbladder was removed and placed in an Ecco sac.  The gallbladder and Ecco sac were then removed through the umbilical port site. The liver bed was irrigated and inspected. Hemostasis was achieved with the  electrocautery. Copious irrigation  was utilized and was repeatedly aspirated until clear.  The pursestring suture was used to close the umbilical fascia.  An additional interrupted 0 Vicryl suture was placed at the umbilical fascia with a PMI suture passer with laparoscopic guidance.  We again inspected the right upper quadrant for hemostasis.  The umbilical closure was inspected and there was no air leak and nothing trapped within the closure. Pneumoperitoneum was released as we removed the trocars.  4-0 Monocryl was used to close the skin by the resident.   Dermabond was applied. The patient was then extubated and brought to the recovery room in stable condition. Instrument, sponge, and needle counts were correct at closure and at the conclusion of the case.   Findings: Chronic cholecystitis  Estimated Blood Loss: Minimal         Drains: none         Specimens: Gallbladder           Complications: None; patient tolerated the procedure well.         Disposition: PACU - hemodynamically stable.         Condition: stable  I was personally present, scrubbed during the entire procedure except for skin closure and immediately available afterwards, as documented in my operative note.   Hunter Foster. Hunter Campanile, MD, FACS General, Bariatric, & Minimally Invasive Surgery [facility] Surgery,  [facility]

## 2023-01-28 NOTE — Anesthesia Procedure Notes (Signed)
Procedure Name: Intubation Date/Time: 01/28/2023 7:43 AM  Performed by: Lovie Chol, CRNAPre-anesthesia Checklist: Patient identified, Emergency Drugs available, Suction available and Patient being monitored Patient Re-evaluated:Patient Re-evaluated prior to induction Oxygen Delivery Method: Circle System Utilized Preoxygenation: Pre-oxygenation with 100% oxygen Induction Type: IV induction Ventilation: Mask ventilation without difficulty Laryngoscope Size: Miller and 3 Grade View: Grade I Tube type: Oral Tube size: 7.5 mm Number of attempts: 1 Airway Equipment and Method: Stylet Placement Confirmation: ETT inserted through vocal cords under direct vision, positive ETCO2 and breath sounds checked- equal and bilateral Secured at: 22 cm Tube secured with: Tape Dental Injury: Teeth and Oropharynx as per pre-operative assessment

## 2023-01-28 NOTE — Anesthesia Postprocedure Evaluation (Signed)
Anesthesia Post Note  Patient: Hunter Foster  Procedure(s) Performed: LAPAROSCOPIC CHOLECYSTECTOMY WITH ICG DYE     Patient location during evaluation: PACU Anesthesia Type: General Level of consciousness: awake and alert Pain management: pain level controlled Vital Signs Assessment: post-procedure vital signs reviewed and stable Respiratory status: spontaneous breathing, nonlabored ventilation, respiratory function stable and patient connected to nasal cannula oxygen Cardiovascular status: blood pressure returned to baseline and stable Postop Assessment: no apparent nausea or vomiting Anesthetic complications: no  No notable events documented.  Last Vitals:  Vitals:   01/28/23 1016 01/28/23 1045  BP: 138/89 (!) 145/82  Pulse: 87 84  Resp:    Temp: (!) 36 C (!) 36.3 C  SpO2: 94% 99%    Last Pain:  Vitals:   01/28/23 1045  TempSrc: Temporal  PainSc: 0-No pain                 Shelton Silvas

## 2023-01-28 NOTE — Transfer of Care (Signed)
Immediate Anesthesia Transfer of Care Note  Patient: Hunter Foster  Procedure(s) Performed: LAPAROSCOPIC CHOLECYSTECTOMY WITH ICG DYE  Patient Location: PACU  Anesthesia Type:General  Level of Consciousness: oriented, sedated, and patient cooperative  Airway & Oxygen Therapy: Patient Spontanous Breathing and Patient connected to face mask oxygen  Post-op Assessment: Report given to RN and Post -op Vital signs reviewed and stable  Post vital signs: Reviewed  Last Vitals:  Vitals Value Taken Time  BP 146/91 01/28/23 0902  Temp    Pulse 65 01/28/23 0905  Resp 12 01/28/23 0905  SpO2 100 % 01/28/23 0905  Vitals shown include unvalidated device data.  Last Pain:  Vitals:   01/28/23 0617  TempSrc: Oral  PainSc:          Complications: No notable events documented.

## 2023-01-29 ENCOUNTER — Encounter (HOSPITAL_COMMUNITY): Payer: Self-pay | Admitting: General Surgery

## 2023-01-29 LAB — SURGICAL PATHOLOGY

## 2023-03-26 ENCOUNTER — Encounter: Payer: 59 | Admitting: Family Medicine

## 2023-03-29 ENCOUNTER — Ambulatory Visit (INDEPENDENT_AMBULATORY_CARE_PROVIDER_SITE_OTHER): Payer: 59 | Admitting: Family Medicine

## 2023-03-29 ENCOUNTER — Encounter: Payer: Self-pay | Admitting: Family Medicine

## 2023-03-29 VITALS — BP 105/68 | HR 87 | Ht 73.0 in | Wt 190.0 lb

## 2023-03-29 DIAGNOSIS — Z0000 Encounter for general adult medical examination without abnormal findings: Secondary | ICD-10-CM

## 2023-03-29 NOTE — Progress Notes (Signed)
BP 105/68   Pulse 87   Ht 6\' 1"  (1.854 m)   Wt 190 lb (86.2 kg)   SpO2 98%   BMI 25.07 kg/m    Subjective:   Patient ID: Hunter Foster, male    DOB: 19-Apr-1981, 42 y.o.   MRN: [id number]  HPI: Hunter Foster is a 42 y.o. male presenting on 03/29/2023 for Medical Management of Chronic Issues (CPE)   HPI Physical exam Patient denies any chest pain, shortness of breath, headaches or vision issues, abdominal complaints, diarrhea, nausea, vomiting, or joint issues.  He had recent laparoscopic cholecystectomy and he seems to be doing well with recovery.  Relevant past medical, surgical, family and social history reviewed and updated as indicated. Interim medical history since our last visit reviewed. Allergies and medications reviewed and updated.  Review of Systems  Constitutional:  Negative for chills and fever.  HENT:  Negative for ear pain and tinnitus.   Eyes:  Negative for pain and discharge.  Respiratory:  Negative for cough, shortness of breath and wheezing.   Cardiovascular:  Negative for chest pain, palpitations and leg swelling.  Gastrointestinal:  Negative for abdominal pain, blood in stool, constipation and diarrhea.  Genitourinary:  Negative for dysuria and hematuria.  Musculoskeletal:  Negative for back pain, gait problem and myalgias.  Skin:  Negative for rash.  Neurological:  Negative for dizziness, weakness and headaches.  Psychiatric/Behavioral:  Negative for suicidal ideas.   All other systems reviewed and are negative.   Per HPI unless specifically indicated above   Allergies as of 03/29/2023   No Known Allergies      Medication List        Accurate as of March 29, 2023  3:13 PM. If you have any questions, ask your nurse or doctor.          STOP taking these medications    ibuprofen 200 MG tablet Commonly known as: ADVIL Stopped by: Elige Radon Ayodeji Keimig   omeprazole 20 MG capsule Commonly known as: PRILOSEC Stopped by: Elige Radon  Florance Paolillo   oxyCODONE 5 MG immediate release tablet Commonly known as: Oxy IR/ROXICODONE Stopped by: Elige Radon Rasha Ibe         Objective:   BP 105/68   Pulse 87   Ht 6\' 1"  (1.854 m)   Wt 190 lb (86.2 kg)   SpO2 98%   BMI 25.07 kg/m   Wt Readings from Last 3 Encounters:  03/29/23 190 lb (86.2 kg)  01/28/23 182 lb 15.7 oz (83 kg)  01/20/23 185 lb (83.9 kg)    Physical Exam Vitals reviewed.  Constitutional:      General: He is not in acute distress.    Appearance: He is well-developed. He is not diaphoretic.  HENT:     Right Ear: External ear normal.     Left Ear: External ear normal.     Nose: Nose normal.     Mouth/Throat:     Pharynx: No oropharyngeal exudate.  Eyes:     General: No scleral icterus.    Conjunctiva/sclera: Conjunctivae normal.  Neck:     Thyroid: No thyromegaly.  Cardiovascular:     Rate and Rhythm: Normal rate and regular rhythm.     Heart sounds: Normal heart sounds. No murmur heard. Pulmonary:     Effort: Pulmonary effort is normal. No respiratory distress.     Breath sounds: Normal breath sounds. No wheezing.  Abdominal:     General: Bowel sounds are normal. There is  no distension.     Palpations: Abdomen is soft.     Tenderness: There is no abdominal tenderness. There is no guarding or rebound.     Hernia: No hernia is present.  Musculoskeletal:        General: Normal range of motion.     Cervical back: Neck supple.  Lymphadenopathy:     Cervical: No cervical adenopathy.  Skin:    General: Skin is warm and dry.     Findings: No rash.  Neurological:     Mental Status: He is alert and oriented to person, place, and time.     Coordination: Coordination normal.  Psychiatric:        Behavior: Behavior normal.       Assessment & Plan:   Problem List Items Addressed This Visit   None Visit Diagnoses     Physical exam    -  Primary   Relevant Orders   CBC with Differential/Platelet   CMP14+EGFR   Lipid panel       Will  do blood work today.  Return in 1 year unless anything on the blood work. Follow up plan: Return in about 1 year (around 03/28/2024), or if symptoms worsen or fail to improve, for Physical exam.  Counseling provided for all of the vaccine components Orders Placed This Encounter  Procedures   CBC with Differential/Platelet   CMP14+EGFR   Lipid panel    Arville Care, MD [facility] 03/29/2023, 3:13 PM

## 2023-04-14 ENCOUNTER — Encounter: Payer: Self-pay | Admitting: Family Medicine

## 2023-04-14 MED ORDER — ROSUVASTATIN CALCIUM 10 MG PO TABS: 10.0000 mg | ORAL_TABLET | Freq: Every day | ORAL | 3 refills | Status: AC

## 2024-01-18 ENCOUNTER — Ambulatory Visit (INDEPENDENT_AMBULATORY_CARE_PROVIDER_SITE_OTHER): Admitting: Nurse Practitioner

## 2024-01-18 ENCOUNTER — Encounter: Payer: Self-pay | Admitting: Nurse Practitioner

## 2024-01-18 VITALS — BP 102/72 | HR 87 | Temp 97.5°F | Ht 73.0 in | Wt 188.0 lb

## 2024-01-18 DIAGNOSIS — R197 Diarrhea, unspecified: Secondary | ICD-10-CM | POA: Diagnosis not present

## 2024-01-18 DIAGNOSIS — A059 Bacterial foodborne intoxication, unspecified: Secondary | ICD-10-CM | POA: Diagnosis not present

## 2024-01-18 DIAGNOSIS — R109 Unspecified abdominal pain: Secondary | ICD-10-CM

## 2024-01-18 DIAGNOSIS — K2970 Gastritis, unspecified, without bleeding: Secondary | ICD-10-CM

## 2024-01-18 MED ORDER — HYOSCYAMINE SULFATE 0.125 MG PO TABS
0.1250 mg | ORAL_TABLET | Freq: Four times a day (QID) | ORAL | 0 refills | Status: DC | PRN
Start: 2024-01-18 — End: 2024-03-30

## 2024-01-18 MED ORDER — ONDANSETRON HCL 4 MG PO TABS: 4.0000 mg | ORAL_TABLET | Freq: Three times a day (TID) | ORAL | 0 refills | Status: DC | PRN

## 2024-01-18 NOTE — Progress Notes (Signed)
 Acute Office Visit  Subjective:     Patient ID: Hunter Foster, male    DOB: 1981/01/06, 43 y.o.   MRN: [id number]  Chief Complaint  Patient presents with   Diarrhea    Upset stomach since Sunday unsure if food poisoning or stomach bug    Fever    HPI  Chief Complaint: Patient presents with diarrhea, upset stomach, and fever since Sunday. He is unsure if symptoms are due to food poisoning or a viral illness.  History of Present Illness (HPI): Hunter Foster is a 43 year old male who presents on Jan 18, 2024, with complaints of gastrointestinal upset, including diarrhea and abdominal cramps, beginning Sunday night. He also reports associated nausea and low-grade fever. The symptoms began after eating at a restaurant on Saturday, where he consumed a cheeseburger. The patient suspects possible food poisoning but is uncertain. He reports that his daughter developed similar symptoms but has since recovered, while his wife, who ate the same food, remains asymptomatic. The last episode of diarrhea occurred last night, but he continues to experience stomach cramps today. No vomiting reported. He denies blood in the stool.   Active Ambulatory Problems    Diagnosis Date Noted   Food poisoning 01/18/2024   Viral gastritis 01/18/2024   Abdominal cramps 01/18/2024   Resolved Ambulatory Problems    Diagnosis Date Noted   No Resolved Ambulatory Problems   Past Medical History:  Diagnosis Date   Asthma    GERD (gastroesophageal reflux disease)     Review of Systems  Constitutional:  Negative for chills and fever.  HENT:  Negative for congestion.   Respiratory:  Negative for cough and shortness of breath.   Cardiovascular:  Negative for chest pain.  Gastrointestinal:  Positive for diarrhea and nausea. Negative for blood in stool, melena and vomiting.       Last episode was last night  Skin:  Negative for rash.  Neurological:  Positive for headaches. Negative for dizziness.    Negative unless indicated in HPI    Objective:    BP 102/72   Pulse 87   Temp (!) 97.5 F (36.4 C) (Temporal)   Ht 6\' 1"  (1.854 m)   Wt 188 lb (85.3 kg)   SpO2 97%   BMI 24.80 kg/m  BP Readings from Last 3 Encounters:  01/18/24 102/72  03/29/23 105/68  01/28/23 (!) 145/82   Wt Readings from Last 3 Encounters:  01/18/24 188 lb (85.3 kg)  03/29/23 190 lb (86.2 kg)  01/28/23 182 lb 15.7 oz (83 kg)      Physical Exam Vitals and nursing note reviewed.  Constitutional:      General: He is not in acute distress. HENT:     Head: Normocephalic and atraumatic.     Nose: Nose normal.     Mouth/Throat:     Mouth: Mucous membranes are moist.  Eyes:     Extraocular Movements: Extraocular movements intact.     Conjunctiva/sclera: Conjunctivae normal.     Pupils: Pupils are equal, round, and reactive to light.  Cardiovascular:     Heart sounds: Normal heart sounds.  Pulmonary:     Effort: Pulmonary effort is normal.     Breath sounds: Normal breath sounds.  Abdominal:     General: Bowel sounds are normal.     Palpations: Abdomen is soft. There is no mass.     Tenderness: There is no abdominal tenderness. There is no right CVA tenderness, left CVA tenderness, guarding or  rebound.  Musculoskeletal:        General: Normal range of motion.     Right lower leg: No edema.     Left lower leg: No edema.  Skin:    General: Skin is warm and dry.     Findings: No rash.  Neurological:     Mental Status: He is alert.  Psychiatric:        Mood and Affect: Mood normal.        Behavior: Behavior normal.        Thought Content: Thought content normal.        Judgment: Judgment normal.     No results found for any visits on 01/18/24.      Assessment & Plan:  Food poisoning -     Stool culture  Viral gastritis -     Ondansetron  HCl; Take 1 tablet (4 mg total) by mouth every 8 (eight) hours as needed for nausea or vomiting.  Dispense: 20 tablet; Refill: 0  Abdominal  cramps -     Hyoscyamine  Sulfate; Take 1 tablet (0.125 mg total) by mouth every 6 (six) hours as needed.  Dispense: 30 tablet; Refill: 0  Diarrhea, unspecified type -     Stool culture  Hunter Foster is a 43 year old Caucasian male seen today for viral gastritis, no acute distress Diarrhea: Stool culture ordered, client to pick up stool kit at the lab collect sample and begin back to the lab to be processed, result pending, bland diet Abdominal cramp: Levsin every 6 hours as needed Nausea: Zofran  as needed Increase hydration, and watching to prevent the spread of the infection Continue healthy lifestyle choices, including diet (rich in fruits, vegetables, and lean proteins, and low in salt and simple carbohydrates) and exercise (at least 30 minutes of moderate physical activity daily).     The above assessment and management plan was discussed with the patient. The patient verbalized understanding of and has agreed to the management plan. Patient is aware to call the clinic if they develop any new symptoms or if symptoms persist or worsen. Patient is aware when to return to the clinic for a follow-up visit. Patient educated on when it is appropriate to go to the emergency department.  Return if symptoms worsen or fail to improve.  Hunter Ambrosius St [place] Thompson, DNP [facility] [address] [place], [place] [address] [phone]  Note: This document was prepared by [employer] voice dictation technology and any errors that results from this process are unintentional.

## 2024-01-19 ENCOUNTER — Encounter: Payer: Self-pay | Admitting: Family Medicine

## 2024-01-20 ENCOUNTER — Ambulatory Visit: Admitting: Family Medicine

## 2024-01-20 ENCOUNTER — Encounter: Payer: Self-pay | Admitting: Family Medicine

## 2024-01-20 VITALS — BP 124/76 | HR 92 | Temp 96.8°F | Ht 73.0 in | Wt 189.4 lb

## 2024-01-20 DIAGNOSIS — R112 Nausea with vomiting, unspecified: Secondary | ICD-10-CM | POA: Diagnosis not present

## 2024-01-20 DIAGNOSIS — S70362A Insect bite (nonvenomous), left thigh, initial encounter: Secondary | ICD-10-CM

## 2024-01-20 DIAGNOSIS — R5381 Other malaise: Secondary | ICD-10-CM

## 2024-01-20 DIAGNOSIS — W57XXXA Bitten or stung by nonvenomous insect and other nonvenomous arthropods, initial encounter: Secondary | ICD-10-CM

## 2024-01-20 DIAGNOSIS — R5383 Other fatigue: Secondary | ICD-10-CM

## 2024-01-20 MED ORDER — DOXYCYCLINE HYCLATE 100 MG PO TABS
100.0000 mg | ORAL_TABLET | Freq: Two times a day (BID) | ORAL | 0 refills | Status: AC
Start: 2024-01-20 — End: 2024-01-30

## 2024-01-20 NOTE — Progress Notes (Signed)
 Subjective:  Patient ID: Hunter Foster, male    DOB: 11-13-1980, 43 y.o.   MRN: [id number]  Patient Care Team: Dettinger, Lucio Sabin, MD as PCP - General (Family Medicine)   Chief Complaint:  Tick Removal (Pt was bit by a tick and now has abd pain with nausea and vomiting)   HPI: Hunter Foster is a 43 y.o. male presenting on 01/20/2024 for Tick Removal (Pt was bit by a tick and now has abd pain with nausea and vomiting)  Hunter Foster is a 43 year old male who presents with abdominal pain following a tick bite.  He describes the abdominal pain as feeling like 'somebody used my guts for a punching bag.' The pain is sore, with no associated nausea or vomiting.  He recalls a tick bite on the back of his left leg, which occurred last Thursday. The area around the bite was very red, but he is unsure how long the tick was attached. No rashes were noticed elsewhere on his body.  He experienced fevers on Monday and Wednesday, along with headaches, which he attributes to dehydration due to not drinking anything on Monday and Tuesday. He has had malaise and fatigue, and he only ate a little bit on Wednesday.  He experienced diarrhea on Monday but has not had any since, as he has not eaten much since Sunday. He was prescribed Zofran  and something for cramps, which he states helped, although he continues to experience some cramping pain.  No new rashes or other symptoms are reported.          Relevant past medical, surgical, family, and social history reviewed and updated as indicated.  Allergies and medications reviewed and updated. Data reviewed: Chart in Epic.   Past Medical History:  Diagnosis Date   Asthma    as a child   GERD (gastroesophageal reflux disease)     Past Surgical History:  Procedure Laterality Date   CHOLECYSTECTOMY N/A 01/28/2023   Procedure: LAPAROSCOPIC CHOLECYSTECTOMY WITH ICG DYE;  Surgeon: Aldean Hummingbird, MD;  Location: [facility];  Service: General;   Laterality: N/A;   WISDOM TOOTH EXTRACTION      Social History   Socioeconomic History   Marital status: Married    Spouse name: Not on file   Number of children: Not on file   Years of education: Not on file   Highest education level: Not on file  Occupational History   Not on file  Tobacco Use   Smoking status: Never   Smokeless tobacco: Never  Vaping Use   Vaping status: Never Used  Substance and Sexual Activity   Alcohol use: Yes    Comment: occasional   Drug use: No   Sexual activity: Not on file  Other Topics Concern   Not on file  Social History Narrative   Not on file   Social Drivers of Health   Financial Resource Strain: Not on file  Food Insecurity: Not on file  Transportation Needs: Not on file  Physical Activity: Not on file  Stress: Not on file  Social Connections: Not on file  Intimate Partner Violence: Not on file    Outpatient Encounter Medications as of 01/20/2024  Medication Sig   doxycycline  (VIBRA -TABS) 100 MG tablet Take 1 tablet (100 mg total) by mouth 2 (two) times daily for 10 days. 1 po bid   hyoscyamine (LEVSIN) 0.125 MG tablet Take 1 tablet (0.125 mg total) by mouth every 6 (six) hours as  needed.   ondansetron  (ZOFRAN ) 4 MG tablet Take 1 tablet (4 mg total) by mouth every 8 (eight) hours as needed for nausea or vomiting.   rosuvastatin  (CRESTOR ) 10 MG tablet Take 1 tablet (10 mg total) by mouth at bedtime.   No facility-administered encounter medications on file as of 01/20/2024.    No Known Allergies  Pertinent ROS per HPI, otherwise unremarkable      Objective:  BP 124/76   Pulse 92   Temp (!) 96.8 F (36 C)   Ht 6\' 1"  (1.854 m)   Wt 189 lb 6.4 oz (85.9 kg)   SpO2 96%   BMI 24.99 kg/m    Wt Readings from Last 3 Encounters:  01/20/24 189 lb 6.4 oz (85.9 kg)  01/18/24 188 lb (85.3 kg)  03/29/23 190 lb (86.2 kg)    Physical Exam Vitals and nursing note reviewed.  Constitutional:      General: He is not in acute  distress.    Appearance: Normal appearance. He is normal weight. He is not ill-appearing, toxic-appearing or diaphoretic.  HENT:     Head: Normocephalic and atraumatic.     Nose: Nose normal.     Mouth/Throat:     Mouth: Mucous membranes are moist.  Eyes:     Conjunctiva/sclera: Conjunctivae normal.     Pupils: Pupils are equal, round, and reactive to light.  Cardiovascular:     Rate and Rhythm: Normal rate and regular rhythm.     Heart sounds: Normal heart sounds.  Pulmonary:     Effort: Pulmonary effort is normal.     Breath sounds: Normal breath sounds.  Abdominal:     General: Bowel sounds are normal. There is no distension.     Palpations: Abdomen is soft.     Tenderness: There is no abdominal tenderness.  Musculoskeletal:     Cervical back: Neck supple.     Right lower leg: No edema.     Left lower leg: No edema.  Skin:    General: Skin is warm and dry.     Capillary Refill: Capillary refill takes less than 2 seconds.     Findings: No rash.  Neurological:     General: No focal deficit present.     Mental Status: He is alert and oriented to person, place, and time.  Psychiatric:        Mood and Affect: Mood normal.        Behavior: Behavior normal.        Thought Content: Thought content normal.        Judgment: Judgment normal.     Results for orders placed or performed in visit on 03/29/23  CBC with Differential/Platelet   Collection Time: 03/29/23  3:16 PM  Result Value Ref Range   WBC 6.9 3.4 - 10.8 x10E3/uL   RBC 4.88 4.14 - 5.80 x10E6/uL   Hemoglobin 16.2 13.0 - 17.7 g/dL   Hematocrit 16.1 09.6 - 51.0 %   MCV 94 79 - 97 fL   MCH 33.2 (H) 26.6 - 33.0 pg   MCHC 35.4 31.5 - 35.7 g/dL   RDW 04.5 40.9 - 81.1 %   Platelets 278 150 - 450 x10E3/uL   Neutrophils 56 Not Estab. %   Lymphs 35 Not Estab. %   Monocytes 8 Not Estab. %   Eos 1 Not Estab. %   Basos 0 Not Estab. %   Neutrophils Absolute 3.8 1.4 - 7.0 x10E3/uL   Lymphocytes Absolute 2.5 0.7 -  3.1  x10E3/uL   Monocytes Absolute 0.5 0.1 - 0.9 x10E3/uL   EOS (ABSOLUTE) 0.1 0.0 - 0.4 x10E3/uL   Basophils Absolute 0.0 0.0 - 0.2 x10E3/uL   Immature Granulocytes 0 Not Estab. %   Immature Grans (Abs) 0.0 0.0 - 0.1 x10E3/uL  CMP14+EGFR   Collection Time: 03/29/23  3:16 PM  Result Value Ref Range   Glucose 89 70 - 99 mg/dL   BUN 13 6 - 20 mg/dL   Creatinine, Ser 9.14 0.76 - 1.27 mg/dL   eGFR 77 >78 GN/FAO/1.30   BUN/Creatinine Ratio 11 9 - 20   Sodium 139 134 - 144 mmol/L   Potassium 4.9 3.5 - 5.2 mmol/L   Chloride 101 96 - 106 mmol/L   CO2 23 20 - 29 mmol/L   Calcium  9.9 8.7 - 10.2 mg/dL   Total Protein 7.4 6.0 - 8.5 g/dL   Albumin 4.8 4.1 - 5.1 g/dL   Globulin, Total 2.6 1.5 - 4.5 g/dL   Bilirubin Total 0.7 0.0 - 1.2 mg/dL   Alkaline Phosphatase 68 44 - 121 IU/L   AST 36 0 - 40 IU/L   ALT 37 0 - 44 IU/L  Lipid panel   Collection Time: 03/29/23  3:16 PM  Result Value Ref Range   Cholesterol, Total 271 (H) 100 - 199 mg/dL   Triglycerides 865 (H) 0 - 149 mg/dL   HDL 52 >78 mg/dL   VLDL Cholesterol Cal 29 5 - 40 mg/dL   LDL Chol Calc (NIH) 469 (H) 0 - 99 mg/dL   Chol/HDL Ratio 5.2 (H) 0.0 - 5.0 ratio       Pertinent labs & imaging results that were available during my care of the patient were reviewed by me and considered in my medical decision making.  Assessment & Plan:  Graden was seen today for tick removal.  Diagnoses and all orders for this visit:  Tick bite of left thigh, initial encounter -     Spotted Fever Group Antibodies -     Alpha-Gal Panel -     Lyme Disease Serology w/Reflex -     doxycycline  (VIBRA -TABS) 100 MG tablet; Take 1 tablet (100 mg total) by mouth 2 (two) times daily for 10 days. 1 po bid -     CMP14+EGFR -     CBC with Differential/Platelet  Nausea and vomiting in adult -     Spotted Fever Group Antibodies -     Alpha-Gal Panel -     Lyme Disease Serology w/Reflex -     doxycycline  (VIBRA -TABS) 100 MG tablet; Take 1 tablet (100 mg total)  by mouth 2 (two) times daily for 10 days. 1 po bid -     CMP14+EGFR -     CBC with Differential/Platelet  Malaise and fatigue -     Spotted Fever Group Antibodies -     Alpha-Gal Panel -     Lyme Disease Serology w/Reflex -     doxycycline  (VIBRA -TABS) 100 MG tablet; Take 1 tablet (100 mg total) by mouth 2 (two) times daily for 10 days. 1 po bid -     CMP14+EGFR -     CBC with Differential/Platelet      Tick bite with possible tick-borne illness Tick bite on the back of the left leg occurred last Thursday. Symptoms include malaise, fatigue, and abdominal pain, suggesting a possible tick-borne illness. Differential diagnosis includes Lyme disease and [facility] spotted fever. Early initiation of doxycycline  is recommended to prevent progression.  Blood work will confirm the presence of tick-borne illness, and doxycycline  will be discontinued if results are negative. - Prescribe doxycycline  100 mg twice daily for 10 days. - Order blood work for tick-borne illnesses, including Lyme disease and [place] spotted fever. - Discontinue doxycycline  if blood work is negative. - Check complete blood count, electrolytes, kidney function, and liver function.  Abdominal pain Persistent abdominal pain described as feeling like the abdomen was used as a punching bag. Symptoms have been ongoing for several days. No associated nausea or vomiting currently. Pain is improving but still present.  Nausea and vomiting Zofran  was previously prescribed and provided relief.  Diarrhea Diarrhea was present on Monday but has resolved.  Fever Fever was present on Monday and Wednesday.  Follow-up Monitor blood work results and adjust treatment accordingly. - Notify him to discontinue doxycycline  if blood work is negative for tick-borne illnesses. - Follow up regarding blood work results.         Continue all other maintenance medications.  Follow up plan: Return if symptoms worsen or fail to  improve.   Continue healthy lifestyle choices, including diet (rich in fruits, vegetables, and lean proteins, and low in salt and simple carbohydrates) and exercise (at least 30 minutes of moderate physical activity daily).  Educational handout given for tick bite  The above assessment and management plan was discussed with the patient. The patient verbalized understanding of and has agreed to the management plan. Patient is aware to call the clinic if they develop any new symptoms or if symptoms persist or worsen. Patient is aware when to return to the clinic for a follow-up visit. Patient educated on when it is appropriate to go to the emergency department.   Kattie Parrot, FNP-C [facility] [phone]

## 2024-01-21 ENCOUNTER — Ambulatory Visit

## 2024-01-21 ENCOUNTER — Ambulatory Visit: Payer: Self-pay | Admitting: Family Medicine

## 2024-01-21 NOTE — Telephone Encounter (Signed)
 Copied from CRM [id number]. Topic: Clinical - Lab/Test Results >> Jan 21, 2024  2:02 PM Baldemar Lev wrote: Reason for CRM: Has questions   Pt called to get lab results after receiving a message from a nurse that called originally with results.  Nurse informed the patient of the following results from 01/20/2024: ALT minimally elevated, limit intake of fatty foods and tylenol . Glucose minimally elevated, A1C normal. Negative for lyme disease. Alpha gal and RMSF pending" pt verbalized and no further questions at present time.

## 2024-01-24 LAB — ALPHA-GAL PANEL
Allergen Lamb IgE: <0.1 kU/L
Beef IgE: 0.11 kU/L — AB
IgE (Immunoglobulin E), Serum: 27 [IU]/mL (ref 6–495)
O215-IgE Alpha-Gal: 0.3 kU/L — AB
Pork IgE: <0.1 kU/L

## 2024-01-24 LAB — CMP14+EGFR
ALT: 53 IU/L — ABNORMAL HIGH (ref 0–44)
AST: 30 IU/L (ref 0–40)
Albumin: 4.3 g/dL (ref 4.1–5.1)
Alkaline Phosphatase: 61 IU/L (ref 44–121)
BUN/Creatinine Ratio: 10 (ref 9–20)
BUN: 12 mg/dL (ref 6–24)
Bilirubin Total: 0.6 mg/dL (ref 0.0–1.2)
CO2: 21 mmol/L (ref 20–29)
Calcium: 8.9 mg/dL (ref 8.7–10.2)
Chloride: 97 mmol/L (ref 96–106)
Creatinine, Ser: 1.16 mg/dL (ref 0.76–1.27)
Globulin, Total: 2.2 g/dL (ref 1.5–4.5)
Glucose: 103 mg/dL — ABNORMAL HIGH (ref 70–99)
Potassium: 4 mmol/L (ref 3.5–5.2)
Sodium: 138 mmol/L (ref 134–144)
Total Protein: 6.5 g/dL (ref 6.0–8.5)
eGFR: 82 mL/min/{1.73_m2} (ref >59)

## 2024-01-24 LAB — CBC WITH DIFFERENTIAL/PLATELET
Basophils Absolute: 0 10*3/uL (ref 0.0–0.2)
Basos: 0 %
EOS (ABSOLUTE): 0 10*3/uL (ref 0.0–0.4)
Eos: 1 %
Hematocrit: 47.2 % (ref 37.5–51.0)
Hemoglobin: 15.4 g/dL (ref 13.0–17.7)
Immature Grans (Abs): 0 10*3/uL (ref 0.0–0.1)
Immature Granulocytes: 0 %
Lymphocytes Absolute: 2.1 10*3/uL (ref 0.7–3.1)
Lymphs: 40 %
MCH: 32.1 pg (ref 26.6–33.0)
MCHC: 32.6 g/dL (ref 31.5–35.7)
MCV: 98 fL — ABNORMAL HIGH (ref 79–97)
Monocytes Absolute: 0.5 10*3/uL (ref 0.1–0.9)
Monocytes: 9 %
Neutrophils Absolute: 2.6 10*3/uL (ref 1.4–7.0)
Neutrophils: 50 %
Platelets: 273 10*3/uL (ref 150–450)
RBC: 4.8 x10E6/uL (ref 4.14–5.80)
RDW: 12.7 % (ref 11.6–15.4)
WBC: 5.2 10*3/uL (ref 3.4–10.8)

## 2024-01-24 LAB — SPOTTED FEVER GROUP ANTIBODIES

## 2024-01-24 LAB — LYME DISEASE SEROLOGY W/REFLEX: Lyme Total Antibody EIA: NEGATIVE

## 2024-03-30 ENCOUNTER — Ambulatory Visit: Payer: 59 | Admitting: Family Medicine

## 2024-03-30 ENCOUNTER — Encounter: Payer: Self-pay | Admitting: Family Medicine

## 2024-03-30 VITALS — BP 115/75 | HR 97 | Ht 73.0 in | Wt 190.0 lb

## 2024-03-30 DIAGNOSIS — E785 Hyperlipidemia, unspecified: Secondary | ICD-10-CM | POA: Insufficient documentation

## 2024-03-30 DIAGNOSIS — Z0000 Encounter for general adult medical examination without abnormal findings: Secondary | ICD-10-CM

## 2024-03-30 DIAGNOSIS — Z0001 Encounter for general adult medical examination with abnormal findings: Secondary | ICD-10-CM | POA: Diagnosis not present

## 2024-03-30 DIAGNOSIS — E7800 Pure hypercholesterolemia, unspecified: Secondary | ICD-10-CM

## 2024-03-30 LAB — LIPID PANEL

## 2024-03-30 NOTE — Progress Notes (Signed)
 BP 115/75   Pulse 97   Ht 6' 1 (1.854 m)   Wt 190 lb (86.2 kg)   SpO2 100%   BMI 25.07 kg/m    Subjective:   Patient ID: Hunter Foster, male    DOB: [birth year], 43 y.o.   MRN: [id number]  HPI: Hunter Foster is a 42 y.o. male presenting on 03/30/2024 for Medical Management of Chronic Issues (CPE)   Discussed the use of AI scribe software for clinical note transcription with the patient, who gave verbal consent to proceed.  History of Present Illness   Hunter Foster is a 43 year old male with hypercholesterolemia who presents for a routine physical and checkup.  He has a history of hypercholesterolemia and struggles with medication adherence, attributing it to a lack of habit. He consumes a diet high in fast food due to his job, which he believes contributes to his cholesterol levels, alongside genetic factors.  He has a family history of cardiovascular events, with both parents having had heart attacks. His mother's heart attack occurred about a month ago, which increases his concern about his own cholesterol levels.  Over the past week, he has experienced congestion and head cold symptoms, primarily in his head, accompanied by a cough. He has been managing these symptoms with Nyquil and Tylenol , noting improvement as of last night.  In May, he underwent blood work due to feeling unwell, which he attributes to a tick bite. He reports feeling better now and has no current symptoms related to that incident.  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 He is currently in the process of moving houses and has a daughter who is ready to return to school.          Relevant past medical, surgical, family and social history reviewed and updated as indicated. Interim medical history since our last visit reviewed. Allergies and medications reviewed and updated.  Review of Systems  Constitutional:  Negative for chills and fever.   HENT:  Positive for congestion. Negative for ear pain and tinnitus.   Eyes:  Negative for pain.  Respiratory:  Negative for cough, shortness of breath and wheezing.   Cardiovascular:  Negative for chest pain, palpitations and leg swelling.  Gastrointestinal:  Negative for abdominal pain, blood in stool, constipation and diarrhea.  Genitourinary:  Negative for dysuria and hematuria.  Musculoskeletal:  Negative for back pain and myalgias.  Skin:  Negative for rash.  Neurological:  Negative for dizziness, weakness and headaches.  Psychiatric/Behavioral:  Negative for suicidal ideas.     Per HPI unless specifically indicated above   Allergies as of 03/30/2024   No Known Allergies      Medication List        Accurate as of March 30, 2024  2:49 PM. If you have any questions, ask your nurse or doctor.          STOP taking these medications    hyoscyamine  0.125 MG tablet Commonly known as: Levsin  Stopped by: [facility] LABOR Annisha Baar   ondansetron  4 MG tablet Commonly known as: Zofran  Stopped by: [facility]OR Ressie Slevin       TAKE these medications    rosuvastatin  10 MG tablet Commonly known as: Crestor  Take 1 tablet (10 mg total) by mouth at bedtime.         Objective:   BP 115/75   Pulse 97   Ht 6' 1 (  1.854 m)   Wt 190 lb (86.2 kg)   SpO2 100%   BMI 25.07 kg/m   Wt Readings from Last 3 Encounters:  03/30/24 190 lb (86.2 kg)  01/20/24 189 lb 6.4 oz (85.9 kg)  01/18/24 188 lb (85.3 kg)    Physical Exam Physical Exam   VITALS: BP- 115/75 HEENT: Ears and throat normal. NECK: No inguinal hernia or masses. CHEST: Lungs clear to auscultation bilaterally. CARDIOVASCULAR: Heart sounds normal. Peripheral pulses normal. ABDOMEN: Abdomen non-tender. EXTREMITIES: No edema in extremities. NEUROLOGICAL: Reflexes normal.         Assessment & Plan:   Problem List Items Addressed This Visit       Other   Hyperlipidemia   Relevant Orders   CMP14+EGFR   CBC with  Differential/Platelet   Lipid panel   Other Visit Diagnoses       Physical exam    -  Primary   Relevant Orders   CMP14+EGFR   CBC with Differential/Platelet   Lipid panel           Acute upper respiratory infection, unspecified Recent onset of head congestion and cough, improving with OTC medications. No lower respiratory involvement. - Continue Nyquil and Tylenol , consider Mucinex  for symptom relief.  Pure hypercholesterolemia Likely inherited hypercholesterolemia with family history of heart attacks. Emphasized genetic component and importance of cholesterol management. - Order blood work to assess current cholesterol levels. - Discuss potential need to restart cholesterol medication based on results. - Advise setting a daily reminder on phone for medication adherence.  Adult Wellness Visit Routine visit with no significant health concerns. Physical examination unremarkable. - Perform routine blood work to assess cholesterol levels and other parameters.       Follow up plan: Return in about 1 year (around 03/30/2025), or if symptoms worsen or fail to improve, for Physical exam recheck cholesterol.  Counseling provided for all of the vaccine components Orders Placed This Encounter  Procedures   CMP14+EGFR   CBC with Differential/Platelet   Lipid panel    Fonda Levins, MD [facility] 03/30/2024, 2:49 PM

## 2024-03-31 LAB — CMP14+EGFR
ALT: 32 IU/L (ref 0–44)
AST: 24 IU/L (ref 0–40)
Albumin: 4.6 g/dL (ref 4.1–5.1)
Alkaline Phosphatase: 59 IU/L (ref 44–121)
BUN/Creatinine Ratio: 9 (ref 9–20)
BUN: 10 mg/dL (ref 6–24)
Bilirubin Total: 0.5 mg/dL (ref 0.0–1.2)
CO2: 22 mmol/L (ref 20–29)
Calcium: 9.3 mg/dL (ref 8.7–10.2)
Chloride: 99 mmol/L (ref 96–106)
Creatinine, Ser: 1.07 mg/dL (ref 0.76–1.27)
Globulin, Total: 2.3 g/dL (ref 1.5–4.5)
Glucose: 85 mg/dL (ref 70–99)
Potassium: 4.3 mmol/L (ref 3.5–5.2)
Sodium: 140 mmol/L (ref 134–144)
Total Protein: 6.9 g/dL (ref 6.0–8.5)
eGFR: 90 mL/min/1.73 (ref >59)

## 2024-03-31 LAB — CBC WITH DIFFERENTIAL/PLATELET
Basophils Absolute: 0 x10E3/uL (ref 0.0–0.2)
Basos: 0 %
EOS (ABSOLUTE): 0.2 x10E3/uL (ref 0.0–0.4)
Eos: 2 %
Hematocrit: 48.8 % (ref 37.5–51.0)
Hemoglobin: 16.7 g/dL (ref 13.0–17.7)
Immature Grans (Abs): 0 x10E3/uL (ref 0.0–0.1)
Immature Granulocytes: 0 %
Lymphocytes Absolute: 2.2 x10E3/uL (ref 0.7–3.1)
Lymphs: 25 %
MCH: 34.1 pg — ABNORMAL HIGH (ref 26.6–33.0)
MCHC: 34.2 g/dL (ref 31.5–35.7)
MCV: 100 fL — ABNORMAL HIGH (ref 79–97)
Monocytes Absolute: 0.9 x10E3/uL (ref 0.1–0.9)
Monocytes: 10 %
Neutrophils Absolute: 5.4 x10E3/uL (ref 1.4–7.0)
Neutrophils: 63 %
Platelets: 235 x10E3/uL (ref 150–450)
RBC: 4.9 x10E6/uL (ref 4.14–5.80)
RDW: 12.4 % (ref 11.6–15.4)
WBC: 8.7 x10E3/uL (ref 3.4–10.8)

## 2024-03-31 LAB — LIPID PANEL
Cholesterol, Total: 214 mg/dL — AB (ref 100–199)
HDL: 42 mg/dL (ref >39)
LDL CALC COMMENT:: 5.1 ratio — AB (ref 0.0–5.0)
LDL Chol Calc (NIH): 149 mg/dL — AB (ref 0–99)
Triglycerides: 129 mg/dL (ref 0–149)
VLDL Cholesterol Cal: 23 mg/dL (ref 5–40)

## 2024-04-05 ENCOUNTER — Ambulatory Visit: Payer: Self-pay | Admitting: Family Medicine
